# Patient Record
Sex: Male | Born: 1985 | Race: Black or African American | Hispanic: No | Marital: Single | Smoking: Former smoker
Health system: Southern US, Community
[De-identification: ages and names within clinical notes are randomized; demographics above are authoritative.]

## PROBLEM LIST (undated history)

## (undated) DIAGNOSIS — I10 Essential (primary) hypertension: Secondary | ICD-10-CM

## (undated) HISTORY — DX: Essential (primary) hypertension: I10

---

## 2011-05-19 ENCOUNTER — Inpatient Hospital Stay (HOSPITAL_COMMUNITY)
Admission: EM | Admit: 2011-05-19 | Discharge: 2011-05-26 | DRG: 638 | Disposition: A | Discharge status: Home / Self Care | Payer: Self-pay | Attending: Internal Medicine | Admitting: Internal Medicine

## 2011-05-19 DIAGNOSIS — E871 Hypo-osmolality and hyponatremia: Secondary | ICD-10-CM | POA: Diagnosis present

## 2011-05-19 DIAGNOSIS — E1310 Other specified diabetes mellitus with ketoacidosis without coma: Principal | ICD-10-CM | POA: Diagnosis present

## 2011-05-19 DIAGNOSIS — E7800 Pure hypercholesterolemia, unspecified: Secondary | ICD-10-CM | POA: Diagnosis present

## 2011-05-19 DIAGNOSIS — D72829 Elevated white blood cell count, unspecified: Secondary | ICD-10-CM | POA: Diagnosis present

## 2011-05-19 DIAGNOSIS — F17200 Nicotine dependence, unspecified, uncomplicated: Secondary | ICD-10-CM | POA: Diagnosis present

## 2011-05-19 DIAGNOSIS — N179 Acute kidney failure, unspecified: Secondary | ICD-10-CM | POA: Diagnosis present

## 2011-05-19 DIAGNOSIS — E669 Obesity, unspecified: Secondary | ICD-10-CM | POA: Diagnosis present

## 2011-05-19 DIAGNOSIS — E0781 Sick-euthyroid syndrome: Secondary | ICD-10-CM | POA: Diagnosis present

## 2011-05-19 DIAGNOSIS — I10 Essential (primary) hypertension: Secondary | ICD-10-CM | POA: Diagnosis present

## 2011-05-20 ENCOUNTER — Inpatient Hospital Stay (HOSPITAL_COMMUNITY): Payer: Self-pay

## 2011-05-20 LAB — POCT I-STAT, CHEM 8
BUN: 36 mg/dL — ABNORMAL HIGH (ref 6–23)
Chloride: 107 mEq/L (ref 96–112)
Creatinine, Ser: 1.8 mg/dL — ABNORMAL HIGH (ref 0.50–1.35)
Potassium: 5.6 mEq/L — ABNORMAL HIGH (ref 3.5–5.1)
Sodium: 134 mEq/L — ABNORMAL LOW (ref 135–145)

## 2011-05-20 LAB — GLUCOSE, CAPILLARY
Glucose-Capillary: >600 mg/dL (ref 70–99)
Glucose-Capillary: >600 mg/dL (ref 70–99)
Glucose-Capillary: >600 mg/dL (ref 70–99)
Glucose-Capillary: >600 mg/dL (ref 70–99)
Glucose-Capillary: >600 mg/dL (ref 70–99)
Glucose-Capillary: 477 mg/dL — ABNORMAL HIGH (ref 70–99)
Glucose-Capillary: 304 mg/dL — ABNORMAL HIGH (ref 70–99)
Glucose-Capillary: 261 mg/dL — ABNORMAL HIGH (ref 70–99)
Glucose-Capillary: 239 mg/dL — ABNORMAL HIGH (ref 70–99)
Glucose-Capillary: 220 mg/dL — ABNORMAL HIGH (ref 70–99)
Glucose-Capillary: 205 mg/dL — ABNORMAL HIGH (ref 70–99)

## 2011-05-20 LAB — CARDIAC PANEL(CRET KIN+CKTOT+MB+TROPI)
Relative Index: 1 (ref 0.0–2.5)
Troponin I: <0.3 ng/mL (ref <0.30)
Troponin I: <0.3 ng/mL (ref <0.30)

## 2011-05-20 LAB — BASIC METABOLIC PANEL
BUN: 34 mg/dL — ABNORMAL HIGH (ref 6–23)
BUN: 37 mg/dL — ABNORMAL HIGH (ref 6–23)
BUN: 36 mg/dL — ABNORMAL HIGH (ref 6–23)
BUN: 32 mg/dL — ABNORMAL HIGH (ref 6–23)
CO2: 11 mEq/L — ABNORMAL LOW (ref 19–32)
CO2: 20 mEq/L (ref 19–32)
Calcium: 11.2 mg/dL — ABNORMAL HIGH (ref 8.4–10.5)
Calcium: 11.3 mg/dL — ABNORMAL HIGH (ref 8.4–10.5)
Calcium: 10.5 mg/dL (ref 8.4–10.5)
Chloride: 105 mEq/L (ref 96–112)
Chloride: 116 mEq/L — ABNORMAL HIGH (ref 96–112)
Chloride: 116 mEq/L — ABNORMAL HIGH (ref 96–112)
Creatinine, Ser: 2.34 mg/dL — ABNORMAL HIGH (ref 0.50–1.35)
GFR calc Af Amer: 43 mL/min — ABNORMAL LOW (ref >60)
GFR calc Af Amer: 42 mL/min — ABNORMAL LOW (ref >60)
GFR calc Af Amer: 50 mL/min — ABNORMAL LOW (ref >60)
GFR calc non Af Amer: 31 mL/min — ABNORMAL LOW (ref >60)
GFR calc non Af Amer: 41 mL/min — ABNORMAL LOW (ref >60)
Glucose, Bld: 234 mg/dL — ABNORMAL HIGH (ref 70–99)
Glucose, Bld: 193 mg/dL — ABNORMAL HIGH (ref 70–99)
Glucose, Bld: 320 mg/dL — ABNORMAL HIGH (ref 70–99)
Potassium: 4.1 mEq/L (ref 3.5–5.1)
Potassium: 4.4 mEq/L (ref 3.5–5.1)
Potassium: 4.4 mEq/L (ref 3.5–5.1)
Potassium: 4.5 mEq/L (ref 3.5–5.1)
Sodium: 148 mEq/L — ABNORMAL HIGH (ref 135–145)
Sodium: 149 mEq/L — ABNORMAL HIGH (ref 135–145)

## 2011-05-20 LAB — DIFFERENTIAL
Basophils Absolute: 0 10*3/uL (ref 0.0–0.1)
Eosinophils Absolute: 0 10*3/uL (ref 0.0–0.7)
Eosinophils Relative: 0 % (ref 0–5)
Lymphocytes Relative: 8 % — ABNORMAL LOW (ref 12–46)

## 2011-05-20 LAB — CBC
MCH: 29.1 pg (ref 26.0–34.0)
MCV: 76.5 fL — ABNORMAL LOW (ref 78.0–100.0)
Platelets: 260 10*3/uL (ref 150–400)
RDW: 13.3 % (ref 11.5–15.5)
WBC: 23.7 10*3/uL — ABNORMAL HIGH (ref 4.0–10.5)

## 2011-05-20 LAB — POCT I-STAT 3, VENOUS BLOOD GAS (G3P V)
Acid-base deficit: 16 mmol/L — ABNORMAL HIGH (ref 0.0–2.0)
O2 Saturation: 77 %
Patient temperature: 37
pO2, Ven: 52 mmHg — ABNORMAL HIGH (ref 30.0–45.0)

## 2011-05-20 LAB — MAGNESIUM: Magnesium: 3.8 mg/dL — ABNORMAL HIGH (ref 1.5–2.5)

## 2011-05-20 LAB — RAPID URINE DRUG SCREEN, HOSP PERFORMED
Amphetamines: NOT DETECTED
Barbiturates: NOT DETECTED
Benzodiazepines: NOT DETECTED
Tetrahydrocannabinol: NOT DETECTED

## 2011-05-20 LAB — URINALYSIS, ROUTINE W REFLEX MICROSCOPIC
Bilirubin Urine: NEGATIVE
Leukocytes, UA: NEGATIVE
Nitrite: NEGATIVE
Specific Gravity, Urine: 1.005 (ref 1.005–1.030)
Urobilinogen, UA: 0.2 mg/dL (ref 0.0–1.0)
pH: 5.5 (ref 5.0–8.0)

## 2011-05-20 LAB — LIPASE, BLOOD: Lipase: 137 U/L — ABNORMAL HIGH (ref 11–59)

## 2011-05-20 LAB — URINE MICROSCOPIC-ADD ON

## 2011-05-20 LAB — HEPATIC FUNCTION PANEL
Alkaline Phosphatase: 121 U/L — ABNORMAL HIGH (ref 39–117)
Indirect Bilirubin: 0.4 mg/dL (ref 0.3–0.9)
Total Protein: 8.9 g/dL — ABNORMAL HIGH (ref 6.0–8.3)

## 2011-05-20 LAB — LIPID PANEL
HDL: 52 mg/dL (ref >39)
LDL Cholesterol: 126 mg/dL — ABNORMAL HIGH (ref 0–99)
Triglycerides: 364 mg/dL — ABNORMAL HIGH (ref <150)
VLDL: 73 mg/dL — ABNORMAL HIGH (ref 0–40)

## 2011-05-20 LAB — POCT I-STAT 3, ART BLOOD GAS (G3+)
Acid-base deficit: 15 mmol/L — ABNORMAL HIGH (ref 0.0–2.0)
Patient temperature: 98.2
pH, Arterial: 7.223 — ABNORMAL LOW (ref 7.350–7.450)

## 2011-05-20 LAB — TSH: TSH: 0.235 u[IU]/mL — ABNORMAL LOW (ref 0.350–4.500)

## 2011-05-21 LAB — GLUCOSE, CAPILLARY
Glucose-Capillary: 277 mg/dL — ABNORMAL HIGH (ref 70–99)
Glucose-Capillary: 221 mg/dL — ABNORMAL HIGH (ref 70–99)
Glucose-Capillary: 189 mg/dL — ABNORMAL HIGH (ref 70–99)
Glucose-Capillary: 200 mg/dL — ABNORMAL HIGH (ref 70–99)
Glucose-Capillary: 457 mg/dL — ABNORMAL HIGH (ref 70–99)
Glucose-Capillary: 510 mg/dL — ABNORMAL HIGH (ref 70–99)
Glucose-Capillary: 537 mg/dL — ABNORMAL HIGH (ref 70–99)

## 2011-05-21 LAB — CBC
MCHC: 36.7 g/dL — ABNORMAL HIGH (ref 30.0–36.0)
RDW: 14.4 % (ref 11.5–15.5)
WBC: 18.6 10*3/uL — ABNORMAL HIGH (ref 4.0–10.5)

## 2011-05-21 LAB — COMPREHENSIVE METABOLIC PANEL
ALT: 217 U/L — ABNORMAL HIGH (ref 0–53)
AST: 350 U/L — ABNORMAL HIGH (ref 0–37)
Albumin: 4.2 g/dL (ref 3.5–5.2)
Alkaline Phosphatase: 89 U/L (ref 39–117)
Chloride: 117 mEq/L — ABNORMAL HIGH (ref 96–112)
Potassium: 3.7 mEq/L (ref 3.5–5.1)
Sodium: 151 mEq/L — ABNORMAL HIGH (ref 135–145)
Total Bilirubin: 0.7 mg/dL (ref 0.3–1.2)
Total Protein: 7.6 g/dL (ref 6.0–8.3)

## 2011-05-21 LAB — BASIC METABOLIC PANEL
BUN: 28 mg/dL — ABNORMAL HIGH (ref 6–23)
BUN: 20 mg/dL (ref 6–23)
BUN: 16 mg/dL (ref 6–23)
CO2: 19 mEq/L (ref 19–32)
Calcium: 9.5 mg/dL (ref 8.4–10.5)
Chloride: 115 mEq/L — ABNORMAL HIGH (ref 96–112)
Chloride: 116 mEq/L — ABNORMAL HIGH (ref 96–112)
Creatinine, Ser: 1.68 mg/dL — ABNORMAL HIGH (ref 0.50–1.35)
Creatinine, Ser: 1.56 mg/dL — ABNORMAL HIGH (ref 0.50–1.35)
GFR calc Af Amer: >60 mL/min (ref >60)
GFR calc Af Amer: >60 mL/min (ref >60)
GFR calc non Af Amer: 50 mL/min — ABNORMAL LOW (ref >60)
Glucose, Bld: 137 mg/dL — ABNORMAL HIGH (ref 70–99)
Potassium: 3.9 mEq/L (ref 3.5–5.1)
Potassium: 4.7 mEq/L (ref 3.5–5.1)

## 2011-05-22 LAB — CBC
Platelets: 225 10*3/uL (ref 150–400)
RBC: 5.41 MIL/uL (ref 4.22–5.81)
WBC: 9.8 10*3/uL (ref 4.0–10.5)

## 2011-05-22 LAB — GLUCOSE, CAPILLARY
Glucose-Capillary: 441 mg/dL — ABNORMAL HIGH (ref 70–99)
Glucose-Capillary: 321 mg/dL — ABNORMAL HIGH (ref 70–99)
Glucose-Capillary: 316 mg/dL — ABNORMAL HIGH (ref 70–99)
Glucose-Capillary: 367 mg/dL — ABNORMAL HIGH (ref 70–99)

## 2011-05-22 LAB — BASIC METABOLIC PANEL
CO2: 24 mEq/L (ref 19–32)
Chloride: 109 mEq/L (ref 96–112)
Sodium: 146 mEq/L — ABNORMAL HIGH (ref 135–145)

## 2011-05-22 LAB — T4, FREE: Free T4: 0.87 ng/dL (ref 0.80–1.80)

## 2011-05-22 LAB — TSH: TSH: 0.306 u[IU]/mL — ABNORMAL LOW (ref 0.350–4.500)

## 2011-05-23 LAB — GLUCOSE, CAPILLARY
Glucose-Capillary: 556 mg/dL (ref 70–99)
Glucose-Capillary: 377 mg/dL — ABNORMAL HIGH (ref 70–99)

## 2011-05-23 LAB — BASIC METABOLIC PANEL
BUN: 16 mg/dL (ref 6–23)
Chloride: 112 mEq/L (ref 96–112)
Creatinine, Ser: 1.05 mg/dL (ref 0.50–1.35)
GFR calc Af Amer: >60 mL/min (ref >60)
Glucose, Bld: 380 mg/dL — ABNORMAL HIGH (ref 70–99)

## 2011-05-24 LAB — GLUCOSE, CAPILLARY
Glucose-Capillary: 287 mg/dL — ABNORMAL HIGH (ref 70–99)
Glucose-Capillary: 359 mg/dL — ABNORMAL HIGH (ref 70–99)

## 2011-05-24 LAB — BASIC METABOLIC PANEL
Calcium: 9.7 mg/dL (ref 8.4–10.5)
Chloride: 104 mEq/L (ref 96–112)
Creatinine, Ser: 1 mg/dL (ref 0.50–1.35)
GFR calc Af Amer: >60 mL/min (ref >60)
GFR calc non Af Amer: >60 mL/min (ref >60)

## 2011-05-25 LAB — BASIC METABOLIC PANEL
CO2: 31 mEq/L (ref 19–32)
Chloride: 102 mEq/L (ref 96–112)
GFR calc Af Amer: >60 mL/min (ref >60)
Potassium: 4 mEq/L (ref 3.5–5.1)
Sodium: 141 mEq/L (ref 135–145)

## 2011-05-25 LAB — GLUCOSE, CAPILLARY
Glucose-Capillary: 289 mg/dL — ABNORMAL HIGH (ref 70–99)
Glucose-Capillary: 290 mg/dL — ABNORMAL HIGH (ref 70–99)
Glucose-Capillary: 199 mg/dL — ABNORMAL HIGH (ref 70–99)

## 2011-05-25 LAB — INSULIN-LIKE GROWTH FACTOR: Somatomedin C: 111 ng/mL (ref 100–472)

## 2011-05-25 NOTE — H&P (Signed)
NAMEMarland Kitchen  Brad Murphy, Brad Murphy NO.:  [id number]  MEDICAL RECORD NO.:  [id number]  LOCATION:  2104                         FACILITY:  [facility]  PHYSICIAN:  Eduard Clos, MDDATE OF BIRTH:  1985-11-05  DATE OF ADMISSION:  05/19/2011 DATE OF DISCHARGE:                             HISTORY & PHYSICAL   PRIMARY CARE PHYSICIAN:  Unassigned.  CHIEF COMPLAINT:  Generalized weakness.  HISTORY OF PRESENT ILLNESS:  A 25 year old male with no significant past medical history has been experiencing weakness with polyuria and nausea, vomiting over the last 6 days, comes to the ER and at this time his labs show increased blood sugar more than 700 with an anion gap of 15.  At this time, the patient has been admitted for DKA.  The patient denies any chest pain.  Denies any shortness of breath. Denies any palpitation.  Has some dizziness.  Denies any headache or visual symptoms or focal deficit.  Denies any abdominal pain, dysuria, discharge, or diarrhea.  PAST MEDICAL HISTORY:  Nothing significant.  PAST SURGICAL HISTORY:  None.  MEDICATIONS PER ADMISSION:  None.  FAMILY HISTORY:  Significant for diabetes mellitus type 2.  SOCIAL HISTORY:  The patient smokes cigarettes.  Denies any alcohol or drug abuse.  REVIEW OF SYSTEMS:  Per history of present illness, nothing else significant.  PHYSICAL EXAMINATION:  GENERAL:  The patient examined at bedside, not in acute distress. VITAL SIGNS:  Blood pressure is 150/50, pulse is 119 per minute, temperature 97.4, respirations 18 per minute, O2 sat 99%.  HEENT: Anicteric.  No pallor.  No discharge from ears, eyes, nose, or mouth. CHEST:  Bilateral air entry present.  No rhonchi.  No crepitation. HEART: S1, S2 heard. ABDOMEN:  Soft, nontender.  Bowel sounds heard. CNS:  Alert, awake, oriented to time, place and person.  Moves upper and lower extremities 5/5. EXTREMITIES:  Peripheral pulses felt.  No edema.  LABORATORY DATA:  EKG  shows normal sinus rhythm, heart rate of 115 beats per minute with nonspecific ST-T changes.  Hemoglobin is 18.7, hematocrit is 55.  Basic metabolic panel:  Sodium 134, potassium 5.6, chloride 107, bicarb was 12, anion gap was 15, BUN 36, creatinine 1.8. UA is showing negative for nitrites and leukocytes, glucose more than 1000, ketones 40.  ASSESSMENT: 1. Diabetic ketoacidosis with new-onset diabetes. 2. Elevated blood pressure. 3. Renal failure. 4. Mild hyperkalemia. 5. Obesity.  PLAN: 1. At this time, admit the patient to step-down unit. 2. For his DKA, the patient will be kept on IV insulin as per DKA     protocol, aggressive hydration with fluid.  At this time, 3 L     normal saline bolus has been ordered after which we will place the     patient on normal saline 200 mL/hour.  Slowly change the patient to     Lantus long-acting insulin once his anion gap is corrected.  At     this time, we will be cycling cardiac markers.  We will check     hemoglobin A1c, fasting lipid panel, and TSH, and also get a     portable chest x-ray.  We will add LFTs and  lipase to the line as     the patient does have some nausea, vomiting. 3. Renal failure which could be from his dehydration.  We will be     frequently checking BMET.  I think his creatinine will improve with     hydration. 4. Elevated blood pressure.  We will need to closely observation his     blood pressure during his stay.  If he is showing persistent high     blood pressures then definite antihypertensive may have to be     started. 5. The patient does smoke cigarettes.  The patient will need tobacco     abuse counseling.     Eduard Clos, MD     ANK/MEDQ  D:  05/20/2011  T:  05/20/2011  Job:  [id number]  Electronically Signed by Midge Minium MD on 05/25/2011 01:23:19 AM

## 2011-05-25 NOTE — Consult Note (Signed)
NAMEMarland Kitchen  DONEVAN, BILLER NO.:  [id number]  MEDICAL RECORD NO.:  [id number]  LOCATION:  5021                         FACILITY:  [facility]  PHYSICIAN:  Tera Mater. Evlyn Kanner, M.D. DATE OF BIRTH:  1986-05-31  DATE OF CONSULTATION:  05/24/2011 DATE OF DISCHARGE:                                CONSULTATION   HISTORY OF PRESENT ILLNESS:  Brad Murphy is a 25 year old black male admitted by the hospital service on May 19, 2011.  He presented with new-onset diabetes with really diabetic ketoacidosis.  He was treated exceptionally well with an IV insulin drip, appropriate fluids, careful management of his electrolytes, and his acidosis cleared fairly quickly. The patient had really no past history prior to this.  He said he does not have any problems or symptoms prior to about a week before coming in.  His max ever weight was around 400 pounds later earlier this year. He had previously played football for [employer] and [employer].  Since he stopped playing football, he has gained some more weight progressively.  He is not working at the present time.  He states he has had no unusual symptoms or illnesses.  In fact, he feels fairly well today.  His only complaint is feeling somewhat hot.  He specifically has no blurred vision or neuropathic complaints.  His bowels are working felt well.  His appetite is good.  He ate pretty all much all of breakfast this morning.  PHYSICAL EXAMINATION:  HEENT:  He does have some exotropia.  Sclerae anicteric. NECK:  No thyromegaly is present.  He has deep vein acanthosis. LUNGS:  Clear. EXTREMITIES:  Distal pulses are intact.  There is some chronic venous stasis changes noted. NEUROLOGIC:  He is alert, awake, and mentating well.  LABORATORY DATA:  From yesterday; sodium 149, potassium 4.7, chloride 112, CO2 of 22, BUN 16, creatinine 1.05, and glucose of 380.  Estimated GFR greater than 60 with a calcium of 10.3.  A TSH yesterday  was 0.306 with a free T4 of 0.87.  Labs at presentation, chemistry; sodium 134, potassium 5.6, chloride 107, glucose is listed as greater than 700, I cannot totally get an actual number there, BUN 36, and creatinine 1.8. A urinalysis was positive for 40 mg of ketone, but not any higher.  The urinalysis is otherwise negative.  Blood gas; pH 7.167, pCO2 of 31, and pO2 is less than 52.  I suspect is a venous sample overall.  His troponin was less than 0.30.  His lipid profile reveals total cholesterol 251, triglycerides 264, HDL of 32, and LDL of 126.  Urine drug screen was negative. Pancreatic enzymes were mildly up of lipase of 137.  At one occasion, I can tell that the rechecking A1c was normal at 11.1% with mean plasma glucose of 272.  His renal function has progressively gotten better.  Repeat lipase was normal at 47.  In summary, I have a 25 year old black male presenting with a fairly impressive hyperglycemia with mild acidosis and ketosis.  I suspect this represents atypical or [place] type of diabetes seen in a young black males.  This is a situation where there is temporary  lack of insulin sufficiency so they can develop ketoacidosis, but was not totally insulin deficient.  What the typical pattern is that oral agents can be used with some usefulness.  Given that he has gone back to a normal creatinine and no acidosis, I think starting some metformin is reasonable.  He would be a candidate to consider one of the GLP-1 agents at a later time.  At the present time, his insulin needs are pretty dramatic needing 100 of Lantus twice a day with large sliding scale.  I suspect that 500 insulin may be needed as well.  For completeness sake, we will check IGF-1 morning cortisol just to make sure that there are not any other unusual reasons for the extreme insulin resistance acanthosis though A1c mainly suspect this was slowly developing issue.  I will follow the patient along with  you.  Thank you for allowing me to participate in the care of this patient.          ______________________________ Tera Mater. Evlyn Kanner, M.D.     SAS/MEDQ  D:  05/24/2011  T:  05/24/2011  Job:  [id number]  Electronically Signed by Adrian Prince M.D. on 05/25/2011 02:06:44 PM

## 2011-05-26 LAB — GLUCOSE, CAPILLARY
Glucose-Capillary: 135 mg/dL — ABNORMAL HIGH (ref 70–99)
Glucose-Capillary: 183 mg/dL — ABNORMAL HIGH (ref 70–99)
Glucose-Capillary: 191 mg/dL — ABNORMAL HIGH (ref 70–99)

## 2011-06-11 ENCOUNTER — Inpatient Hospital Stay (HOSPITAL_COMMUNITY)
Admission: EM | Admit: 2011-06-11 | Discharge: 2011-06-14 | DRG: 638 | Disposition: A | Discharge status: Home / Self Care | Payer: Self-pay | Attending: Internal Medicine | Admitting: Internal Medicine

## 2011-06-11 ENCOUNTER — Encounter: Payer: Self-pay | Admitting: Internal Medicine

## 2011-06-11 DIAGNOSIS — F17200 Nicotine dependence, unspecified, uncomplicated: Secondary | ICD-10-CM | POA: Diagnosis present

## 2011-06-11 DIAGNOSIS — Z6841 Body Mass Index (BMI) 40.0 and over, adult: Secondary | ICD-10-CM

## 2011-06-11 DIAGNOSIS — E1310 Other specified diabetes mellitus with ketoacidosis without coma: Principal | ICD-10-CM | POA: Diagnosis present

## 2011-06-11 DIAGNOSIS — Z91199 Patient's noncompliance with other medical treatment and regimen due to unspecified reason: Secondary | ICD-10-CM

## 2011-06-11 DIAGNOSIS — Z9119 Patient's noncompliance with other medical treatment and regimen: Secondary | ICD-10-CM

## 2011-06-11 DIAGNOSIS — E1010 Type 1 diabetes mellitus with ketoacidosis without coma: Secondary | ICD-10-CM

## 2011-06-11 DIAGNOSIS — Z79899 Other long term (current) drug therapy: Secondary | ICD-10-CM

## 2011-06-11 DIAGNOSIS — E785 Hyperlipidemia, unspecified: Secondary | ICD-10-CM | POA: Diagnosis present

## 2011-06-11 DIAGNOSIS — Z794 Long term (current) use of insulin: Secondary | ICD-10-CM

## 2011-06-11 LAB — GLUCOSE, CAPILLARY
Glucose-Capillary: >600 mg/dL (ref 70–99)
Glucose-Capillary: >600 mg/dL (ref 70–99)
Glucose-Capillary: >600 mg/dL (ref 70–99)

## 2011-06-11 LAB — BASIC METABOLIC PANEL
BUN: 18 mg/dL (ref 6–23)
CO2: 24 mEq/L (ref 19–32)
Calcium: 11.3 mg/dL — ABNORMAL HIGH (ref 8.4–10.5)
Chloride: 101 mEq/L (ref 96–112)
Creatinine, Ser: 1.25 mg/dL (ref 0.50–1.35)
GFR calc Af Amer: >60 mL/min (ref >60)
GFR calc non Af Amer: >60 mL/min (ref >60)
GFR calc non Af Amer: >60 mL/min (ref >60)
Glucose, Bld: 1130 mg/dL (ref 70–99)
Glucose, Bld: 247 mg/dL — ABNORMAL HIGH (ref 70–99)
Potassium: 4.3 mEq/L (ref 3.5–5.1)
Sodium: 132 mEq/L — ABNORMAL LOW (ref 135–145)
Sodium: 141 mEq/L (ref 135–145)

## 2011-06-11 LAB — URINALYSIS, ROUTINE W REFLEX MICROSCOPIC
Bilirubin Urine: NEGATIVE
Hgb urine dipstick: NEGATIVE
Ketones, ur: 15 mg/dL — AB
Nitrite: NEGATIVE
Protein, ur: NEGATIVE mg/dL
Specific Gravity, Urine: 1.032 — ABNORMAL HIGH (ref 1.005–1.030)
Urobilinogen, UA: 0.2 mg/dL (ref 0.0–1.0)

## 2011-06-11 NOTE — H&P (Signed)
Hospital Admission Note Date: 06/11/2011  Patient name: Brad Murphy Medical record number: [id number] Date of birth: [birth year] Age: 25 y.o. Gender: male PCP: No primary provider on file.  Medical Service: Medicine Teaching Service  Attending physician:  Dr. Meredith Pel Resident (R2/R3):  Dr. Gilford Rile   Pager: [phone] Resident (R1):  Dr. Dierdre Searles    Pager: [phone]  Chief Complaint: Nausea, polyuria, polydipsia.   History of Present Illness: This is a 25 year old male with a past medical history of recently diagnosed diabetes with an A1c of 11.1 which was diagnosed 2 weeks ago when the patient presented with DKA to [facility], patient was treated and given insulin was told to followup with [facility]. However the patient did not followup and ran out of his insulin one week ago, and since he has been progressively more nauseated and complaining of polyuria and polydipsia this led him to come to the ED once more today. Upon arrival to the ED the patient's CBG was 1130, and anion gap of 16, aside from the polyuria polydipsia and nausea patient denies any other complaints to report that he's feeling well and denied any other new complaints, denied fever chills, shortness of breath or chest pain.  Medication:   Lantus insulin 120 units subq b.i.d.  NovoLog insulin 15 units subq 3 times daily with meals.  Lisinopril 10 mg p.o. daily.  Metformin 1000 mg p.o. b.i.d. with meals.    Allergies:  None.  Past Medical and Surgical History:  None.     Family History:  Significant for diabetes mellitus type 2, HLD and HTN, negative for premature CAD    Social History:  The patient smokes 1-5 cigarettes per day.  Denies any alcohol or drug abuse.   Review of Systems: Negative, except per HPI.   Physical Exam: Vitals: T=98.1,  BP=123/60 HR=127, RR=20, O2 Sat= 96% on RA. General:  Obese, alert, well-developed, and cooperative to examination.   Head:  normocephalic and atraumatic.   Eyes:  vision  grossly intact, pupils equal, pupils round, pupils reactive to light, no injection and anicteric.   Mouth:  pharynx pink and moist, no erythema, and no exudates.   Neck:  supple, full ROM, no thyromegaly, no JVD, and no carotid bruits.   Lungs:  normal respiratory effort, no accessory muscle use, normal breath sounds, no crackles, and no wheezes. Heart:  normal rate, regular rhythm, no murmur, no gallop, and no rub.   Abdomen:  soft, non-tender, normal bowel sounds, no distention, no guarding, no rebound tenderness, no hepatomegaly, and no splenomegaly.   Msk:  no joint swelling, no joint warmth, and no redness over joints.   Pulses:  2+ DP/PT pulses bilaterally Extremities:  No cyanosis, clubbing, edema Neurologic:  alert & oriented X3, cranial nerves II-XII intact, strength normal in all extremities, sensation intact to light touch, and gait normal.   Skin:  turgor normal and no rashes.   Psych:  Oriented X3, memory intact for recent and remote, normally interactive, good eye contact, not anxious appearing, and not depressed appearing.   Lab results: Admission on 06/11/2011  Component Value Range  . Glucose-Capillary (mg/dL) >621* 30-86  . Comment 1  Documented in Chart    . Comment 2  Control and instrumentation engineer    . Color, Urine  YELLOW  YELLOW  . Appearance  CLEAR  CLEAR  . Specific Gravity, Urine  1.032* 1.005-1.030  . pH  6.5  5.0-8.0  . Glucose, UA (mg/dL) >5784* NEGATIVE  . Hgb urine dipstick  NEGATIVE  NEGATIVE  . Bilirubin Urine  NEGATIVE  NEGATIVE  . Ketones, ur (mg/dL) 15* NEGATIVE  . Protein, ur (mg/dL) NEGATIVE  NEGATIVE  . Urobilinogen, UA (mg/dL) 0.2  4.0-9.8  . Nitrite  NEGATIVE  NEGATIVE  . Leukocytes, UA  NEGATIVE  NEGATIVE  . Squamous Epithelial / LPF  RARE  RARE  . Sodium (mEq/L) 132* 135-145  . Potassium (mEq/L) 4.7  3.5-5.1   HEMOLYSIS AT THIS LEVEL MAY AFFECT RESULT  . Chloride (mEq/L) 92* 96-112  . CO2 (mEq/L) 24  19-32  . Glucose, Bld (mg/dL) 1191* 47-82   Comment:  REPEATED TO VERIFY                          CRITICAL RESULT CALLED TO, READ BACK BY AND VERIFIED WITH:                          R HORIZA,RN 1516 06/11/11 D BRADLEY                          RESULT CONFIRMED BY AUTOMATED DILUTION  . BUN (mg/dL) 18  9-56  . Creatinine, Ser (mg/dL) 2.13  0.86-5.78  . Calcium (mg/dL) 46.9* 6.2-95.2  . GFR calc non Af Amer (mL/min) >60  >60  . GFR calc Af Amer (mL/min) >60  >60   Comment:                                 The eGFR has been calculated                          using the MDRD equation.                          This calculation has not been                          validated in all clinical                          situations.                          eGFR's persistently                          <60 mL/min signify                          possible Chronic Kidney Disease.  . Glucose-Capillary (mg/dL) >841* 32-44     Imaging results:  None  Assessment & Plan by Problem:  DKA: Patient is clinically stable, CBG's 1130 on admission with AG 16, with history of recently diagnosed and hospital admission for DKA secondary to DM with last hemoglobin A1c of 11.1. Since the patient has been using insulin which he was given from the hospital, but this has runout 1 week ago and patient has been without insulin for 1 week. At this point I do not suspect an infecious causes such as PNA, UTI or an ischemic/inflammatory conditions (MI, Pancreatitis).  Plan: Will admit to SDU Will check HgA1c, Will treat patient's DKA with  Insulin drip,  saline IV fluids  Will check blood glucose every 2 hours , as patient's blood glucose reach 250's, will continue with D5W until his anion gap is closed, when this is accomplished, will stop the insulin drip and start long acting insulin with sliding scale coverage and start checking blood sugars before meals and at bedtime.     PGY3______________________________________     ATTENDING: I performed and/or observed a history  and physical examination of the patient.  I discussed the case with the residents as noted and reviewed the residents' notes.  I agree with the findings and plan--please refer to the attending physician note for more details.  Signature________________________________  Printed Name_____________________________

## 2011-06-12 LAB — GLUCOSE, CAPILLARY
Glucose-Capillary: 229 mg/dL — ABNORMAL HIGH (ref 70–99)
Glucose-Capillary: 235 mg/dL — ABNORMAL HIGH (ref 70–99)

## 2011-06-12 LAB — CBC
HCT: 40.4 % (ref 39.0–52.0)
Hemoglobin: 14.2 g/dL (ref 13.0–17.0)
MCH: 28 pg (ref 26.0–34.0)
MCHC: 35.1 g/dL (ref 30.0–36.0)

## 2011-06-12 LAB — LIPID PANEL
LDL Cholesterol: 86 mg/dL (ref 0–99)
Triglycerides: 196 mg/dL — ABNORMAL HIGH (ref <150)
VLDL: 39 mg/dL (ref 0–40)

## 2011-06-12 LAB — HEMOGLOBIN A1C: Hgb A1c MFr Bld: 12.7 % — ABNORMAL HIGH (ref <5.7)

## 2011-06-12 LAB — BASIC METABOLIC PANEL
BUN: 10 mg/dL (ref 6–23)
Calcium: 9.9 mg/dL (ref 8.4–10.5)
Chloride: 101 mEq/L (ref 96–112)
Creatinine, Ser: 0.9 mg/dL (ref 0.50–1.35)
Creatinine, Ser: 0.89 mg/dL (ref 0.50–1.35)
GFR calc Af Amer: >60 mL/min (ref >60)
GFR calc non Af Amer: >60 mL/min (ref >60)

## 2011-06-13 DIAGNOSIS — E1010 Type 1 diabetes mellitus with ketoacidosis without coma: Secondary | ICD-10-CM

## 2011-06-13 LAB — GLUCOSE, CAPILLARY
Glucose-Capillary: 444 mg/dL — ABNORMAL HIGH (ref 70–99)
Glucose-Capillary: 496 mg/dL — ABNORMAL HIGH (ref 70–99)
Glucose-Capillary: 273 mg/dL — ABNORMAL HIGH (ref 70–99)
Glucose-Capillary: 303 mg/dL — ABNORMAL HIGH (ref 70–99)
Glucose-Capillary: 358 mg/dL — ABNORMAL HIGH (ref 70–99)

## 2011-06-13 LAB — BASIC METABOLIC PANEL
BUN: 7 mg/dL (ref 6–23)
CO2: 28 mEq/L (ref 19–32)
Chloride: 104 mEq/L (ref 96–112)
Creatinine, Ser: 0.81 mg/dL (ref 0.50–1.35)
Glucose, Bld: 218 mg/dL — ABNORMAL HIGH (ref 70–99)

## 2011-06-14 LAB — BASIC METABOLIC PANEL
BUN: 6 mg/dL (ref 6–23)
Chloride: 102 mEq/L (ref 96–112)
Creatinine, Ser: 0.79 mg/dL (ref 0.50–1.35)
GFR calc Af Amer: >60 mL/min (ref >60)
GFR calc non Af Amer: >60 mL/min (ref >60)
Glucose, Bld: 240 mg/dL — ABNORMAL HIGH (ref 70–99)

## 2011-06-14 LAB — CBC
HCT: 39.7 % (ref 39.0–52.0)
Hemoglobin: 13.7 g/dL (ref 13.0–17.0)
MCHC: 34.5 g/dL (ref 30.0–36.0)
MCV: 79.2 fL (ref 78.0–100.0)
RDW: 13.4 % (ref 11.5–15.5)

## 2011-06-14 LAB — GLUCOSE, CAPILLARY
Glucose-Capillary: 272 mg/dL — ABNORMAL HIGH (ref 70–99)
Glucose-Capillary: 253 mg/dL — ABNORMAL HIGH (ref 70–99)

## 2011-06-17 ENCOUNTER — Encounter: Payer: Self-pay | Admitting: Internal Medicine

## 2011-06-17 ENCOUNTER — Telehealth: Payer: Self-pay | Admitting: Dietician

## 2011-06-17 ENCOUNTER — Ambulatory Visit (INDEPENDENT_AMBULATORY_CARE_PROVIDER_SITE_OTHER): Payer: Self-pay | Admitting: Internal Medicine

## 2011-06-17 DIAGNOSIS — E119 Type 2 diabetes mellitus without complications: Secondary | ICD-10-CM

## 2011-06-17 DIAGNOSIS — E1165 Type 2 diabetes mellitus with hyperglycemia: Secondary | ICD-10-CM | POA: Insufficient documentation

## 2011-06-17 LAB — GLUCOSE, CAPILLARY: Glucose-Capillary: 248 mg/dL — ABNORMAL HIGH (ref 70–99)

## 2011-06-17 MED ORDER — LISINOPRIL 2.5 MG PO TABS: 2.5000 mg | ORAL_TABLET | Freq: Every day | ORAL | Status: DC

## 2011-06-17 NOTE — Patient Instructions (Signed)
Please schedule a follow up appointment with our diabetes educator and counselor in 1week. Please schedule a follow up appointment with the clinic doctor in 1 month.

## 2011-06-17 NOTE — Progress Notes (Signed)
  Subjective:    Patient ID: Brad Murphy, male    DOB: September 18, 1986, 25 y.o.   MRN: [id number]  HPI: 25 year old man with newly diagnosed diabetes comes to the clinic for a hospital followup.  Patient was recently hospitalized through 06/11/2011 to 06/14/2011 for diabetic ketoacidosis.  He has not been checking his blood sugars because he does not have a meter. But he states that he has been taking NovoLog 70/30 twice daily. He denies any abdominal pain, nausea, vomiting or diarrhea.    Review of Systems  Constitutional: Negative for fever, chills, activity change, appetite change and fatigue.  HENT: Negative for nosebleeds, congestion, rhinorrhea, sneezing and postnasal drip.   Eyes: Negative for visual disturbance.  Respiratory: Negative for apnea, cough, choking, chest tightness, shortness of breath, wheezing and stridor.   Cardiovascular: Negative for chest pain, palpitations and leg swelling.  Gastrointestinal: Negative for nausea, diarrhea, constipation and abdominal distention.  Genitourinary: Negative for urgency, flank pain and decreased urine volume.  Musculoskeletal: Negative for arthralgias.  Neurological: Negative for seizures, speech difficulty and headaches.  Hematological: Negative for adenopathy.       Objective:   Physical Exam  Constitutional: He is oriented to person, place, and time. He appears well-developed and well-nourished. No distress.  HENT:  Head: Normocephalic and atraumatic.  Mouth/Throat: No oropharyngeal exudate.  Eyes: Conjunctivae and EOM are normal. Pupils are equal, round, and reactive to light.  Neck: Normal range of motion. Neck supple. No JVD present. No tracheal deviation present. No thyromegaly present.  Cardiovascular: Normal rate, regular rhythm, normal heart sounds and intact distal pulses.  Exam reveals no gallop and no friction rub.   No murmur heard. Pulmonary/Chest: Effort normal and breath sounds normal. No stridor. No respiratory  distress. He has no wheezes. He has no rales. He exhibits no tenderness.  Abdominal: Soft. Bowel sounds are normal. He exhibits no distension and no mass. There is no tenderness. There is no rebound and no guarding.  Musculoskeletal: Normal range of motion. He exhibits no edema and no tenderness.  Lymphadenopathy:    He has no cervical adenopathy.  Neurological: He is alert and oriented to person, place, and time. He has normal reflexes. He displays normal reflexes. No cranial nerve deficit. Coordination normal.  Skin: Skin is warm. He is not diaphoretic.          Assessment & Plan:

## 2011-06-17 NOTE — Telephone Encounter (Signed)
Left message. Call returned by his mother. Left another message for patient to call.

## 2011-06-18 LAB — MICROALBUMIN / CREATININE URINE RATIO: Microalb, Ur: 0.99 mg/dL (ref 0.00–1.89)

## 2011-06-18 LAB — BASIC METABOLIC PANEL WITH GFR
Calcium: 9 mg/dL (ref 8.4–10.5)
Chloride: 102 mEq/L (ref 96–112)
Creat: 0.89 mg/dL (ref 0.50–1.35)
GFR, Est African American: >60 mL/min (ref >60)
GFR, Est Non African American: >60 mL/min (ref >60)

## 2011-06-20 ENCOUNTER — Encounter: Payer: Self-pay | Admitting: Internal Medicine

## 2011-06-20 MED ORDER — GLUCOSE BLOOD VI STRP: ORAL_STRIP | Status: AC

## 2011-06-20 MED ORDER — WAVESENSE PRESTO W/DEVICE KIT: 1.0000 | PACK | Freq: Four times a day (QID) | Status: DC

## 2011-06-20 MED ORDER — LANCETS 30G MISC: Status: DC

## 2011-06-20 NOTE — Assessment & Plan Note (Signed)
Hospitalized through 06/11/2011 to 06/14/2003 for diabetic ketoacidosis.He has not been checking his blood sugars but states taking insulin 70/30 regularly.  - Will give him a script for meter. - We'll get UA and microalbumin to creatinine ratio. Start him on low dose ACE- I. - Will refer him to [facility] for financial assistance. - Will schedule and appointment with our diabetes educator and counselor. - Will get a  BMET.

## 2011-06-24 NOTE — Telephone Encounter (Signed)
Have not had return call form patient. Will try to touch base with him at his doctor visits on 07/13/11 with Dr. Candy Sledge.

## 2011-06-27 NOTE — Discharge Summary (Signed)
NAMEMarland Murphy  RODD, HEFT                ACCOUNT NO.:  [id number]  MEDICAL RECORD NO.:  [id number]  LOCATION:                                 FACILITY:  PHYSICIAN:  Clydia Llano, MD       DATE OF BIRTH:  17-Dec-1985  DATE OF ADMISSION:  05/19/2011 DATE OF DISCHARGE:  05/26/2011                              DISCHARGE SUMMARY   PRIMARY CARE PHYSICIAN:  [facility].  REASON FOR ADMISSION:  Generalized weakness.  DISCHARGE DIAGNOSES: 1. Diabetic ketoacidosis, resolved. 2. Diabetes mellitus, new onset. 3. Hypertension. 4. Acute renal failure, resolved. 5. Obesity. 6. Sick euthyroid syndrome.  DISCHARGE MEDICATIONS: 1. Lantus insulin 120 units subcu b.i.d. 2. NovoLog insulin 15 units subcu 3 times daily with meals. 3. Lisinopril 10 mg p.o. daily. 4. Metformin 1000 mg p.o. b.i.d. with meals.  BRIEF HISTORY AND EXAMINATION:  Mr. Dault is a 25 year old African American male with no significant past medical history who came into the hospital because of weakness.  The patient for 6 days prior to admission was experiencing the weakness along with polyuria, nausea, and vomiting at this time.  The patient does have morbid obesity with his weight around 400 kg.  Upon initial evaluation in the emergency department, the patient did have blood work consistent with DKA with blood glucose of 748, bicarb is 11, and renal failure with creatinine 1.8.  The patient was admitted to the hospital for further evaluation.  RADIOLOGY:  Abdominal ultrasound showed unremarkable.  Renal ultrasound somewhat limited because of body habitus.Brad Murphy  BRIEF HOSPITAL STAY: 1. DKA and new onset diabetes.  The patient was admitted to Step-Down     Unit because of the DKA.  He was started on IV insulin.  The     patient's blood pressure was controlled pretty fast and the gap was     closed as well as his acidosis resolved.  It was being noticed that     he required very high dose of Lantus insulin, so Dr. Adrian Prince     from Endocrine was consulted.  He suspected that the patient has     [place] type of diabetes seen in young black males.  Presentation     is atypical with very high hyperglycemia and mild ketoacidosis.     The patient is currently taking 120 units of Lantus twice a day     with 15 units of NovoLog with meals.  He suspected the U-500     insulin will be of some use in this young gentleman.  Currently,     continue same medication.  The patient is to follow up with     [facility] for further evaluation.. 2. Hypertension.  Blood pressure was in the high side of 140-150s for     systolic blood pressure.  Lisinopril 10 mg was added to his     medication as well as its glomerular protective effect. 3. Acute renal failure.  The patient's creatinine was 1.8 at the time     of admission, at the time of discharge was 0.9.  This is thought to     be secondary to dehydration from DKA and treated aggressively with  IV fluids. 4. Euthyroid sick syndrome.  The patient has TSH of 0.306.  His free     T4 is normal.  Total T4 is normal and the T3 is 61.  Dr. Evlyn Kanner     recommended to repeat the thyroid indices as an outpatient in 4-6     weeks.  DISCHARGE INSTRUCTIONS: 1. Activity:  As tolerated. 2. Disposition:  Home.  DIET:  Carbohydrate-modified diet.     Clydia Llano, MD     ME/MEDQ  D:  05/26/2011  T:  05/27/2011  Job:  [id number]  cc:   Clinic [facility]  Electronically Signed by Clydia Llano  on 06/27/2011 09:57:31 AM

## 2011-07-09 NOTE — Discharge Summary (Signed)
NAMEMarland Murphy  CAYCE, Brad Murphy NO.:  [id number]  MEDICAL RECORD NO.:  [id number]  LOCATION:  3037                         FACILITY:  [facility]  PHYSICIAN:  Ileana Roup, M.D.  DATE OF BIRTH:  [birth year]  DATE OF ADMISSION:  06/11/2011 DATE OF DISCHARGE:  06/14/2011                              DISCHARGE SUMMARY   PRIMARY CARE PHYSICIAN: [facility] outpatient [facility].  DISCHARGE DIAGNOSES: 1. Diabetic ketoacidosis  2. Recently diagnosed diabetes mellitus type 2. 3. Hyperlipidemia. 4. Class III, morbid obesity.  DISCHARGE MEDICATIONS WITH ACCURATE DOSES: 1. NovoLog 70/30, 50 units SQ twice daily with meals, breakfast and supper. 2. Metformin 500mg  2 tabs p.o. twice daily with meals.  DISPOSITION AND FOLLOWUP:  This is a 25 year old male with past medical history of recently diagnosed diabetes with a hemoglobin A1c of 11.1 on admission.  This patient was treated with a DKA protocol and discharged on June 14, 2011.    1. [name] at 10:15 a.m. on June 17, 2011 with Dr. Dorthula Rue.   During this visit, the patient will need to have his [employer] and MAP Program setup.   The patient is instructed to bring all of his identification information  and his financial documentations to facilitate the process.   He will need his BMET checked.   2. The patient will also need to have a followup appointment by [name]. He was taught on how to use his NovoLog 70/30 insulin upon discharge, and he will need more education and reinforcement on diet, lifestyle changes  and medications.  Also during last hospitalization, the patient was put on lisinopril 10 mg p.o. daily which he did not take after he was discharged.  During this admission, we did not start lisinopril  due to his acute DKA problem.  He will need to be reevaluated whether  he needs lisinopril daily.  Given his history of poorly controlled diabetes, I think we  could start him on lisinopril 5 mg p.o. daily, monitor his blood pressure and kidney function closely as an outpatient.  I did not write him prescription for lisinopril, which will need to be readdressed during his office visit with Dr. Dorthula Rue.  PROCEDURES:  There is no procedures performed during this admission.  CONSULTATIONS:  There is no consultations ordered during this admission. However, we did ask case manager to work with him to see if he can get any help in terms of his medication supplies.  BRIEF ADMITTING HISTORY AND PHYSICAL:  This is a 25 year old male with a past medical history of recently diagnosed diabetes with a hemoglobin A1c of 11.1 which was diagnosed 2 weeks ago when the patient presented with DKA to [facility], the patient was treated and given insulin and was told to follow up with [facility].  However, the patient did not follow up and ran out of his insulin 1 week ago, and since he has been progressively more nauseated and complaining of polyuria and polydipsia, this led him to come to the ED once more today, June 11, 2011.  Upon arrival to the ED, the patient's CBG was 1130, and anion gap of 16,  aside from the polyuria, polydipsia, and a nausea, the patient denies any other complaints to report that he is feeling well.  He denied any other new complaints, denied fever, chills, shortness of breath, or chest pain.  PHYSICAL EXAMINATION:   VITAL SIGNS:  T 98.1, BP 123/60, HR 127, RR 20, oxygen saturation 96% on room air. GENERAL:  Obese, alert, well developed, and cooperative to examination. HEENT:  normocephalic and atraumatic. vision grossly intact.   PERRL. No injection or icteric.  Mouth pink and moist.  No erythema and no exudates. NECK:  Supple.  Full ROM.  No thyromegaly, no JVD, and no carotid bruits. LUNGS:  Normal respiratory effort.  No accessory muscle use.  Normal breath sounds.  No crackles, and no wheezes. HEART:  Normal rate,  regular rhythm, no murmur, no gallops, and no rubs. ABDOMEN:  Soft.  No tenderness.  Normal bowel sounds.  No distention, no guarding, no rebound tenderness, no hepatomegaly, and no splenomegaly. MUSCULOSKELETAL:  No joint swelling, warmth, and redness over joints. Pulse 2+ DP/PT pulses bilaterally. EXTREMITIES:  No cyanosis, clubbing, edema. NEUROLOGIC:  Alert and oriented x3.  Cranial nerves II-XII intact. Strength normal in all extremities.  Sensation intact to light touch and gait normal. SKIN:  Turgor normal and no rashes. PSYCHIATRIC:  Oriented x3.  Memory intact for recent and remote, normally interactive, good eye contact, not anxious appearing, and not depressed appearing.  ADMISSION LABS:   Glucose more than 600.  Urine glucose more than 1000,urine ketones 15 mg/dL.  sodium 132, potassium 4.7, chloride 92,CO2 24, glucose 1130, BUN 18,  creatinine 1.25, calcium 11.3.  WBC 8.2, HB 14.2, HCT 40.4, and platelets 173.   Lipid panel; cholesterol 166, triglyceride 196, HDL 41, LDL 86,VLDL 39. Hemoglobin A1c 12.7 on this admission.  HOSPITAL COURSE BY PROBLEMS: 1. Diabetic ketoacidosis secondary to diabetes type 2.     Pt presented with CBG 1130 on admission with an AG 16.      He had recently been diagnosed with DM with recent     hospitalization for DKA secondary to diabetes mellitus.     This pt was noncompliant and did not fill his insulin prescription x 1 week,     and he was admitted again for DKA during this admission. His condition was      largely improved with DKA protocol and insuin treatment.     He is discharged on Novolog 70/30 and metformin.  2. Hyperlipidemia.       Given history of recently diagnosed diabetes and family     history of hyperlipidemia, the optimal goal for his LDL is less     than 70.     I have discussed with the patient about diet, exercise, and life style     changes to lose weight.  He has had a diabetic education and     dietitian education  from the last hospitalization and denied     further education during this admission.        We may consider to start statin if all of the conservative methods fail.  3. Class III morbid obesity.  The patient's BMI is 48 during this     admission.      Upon discharge,I have given him a new vial of NovoLog 70/30 insulin from [facility].  I have also given him     prescriptions for NovoLog 70/30, 50 units SQ twice a day and  metformin 1000 mg p.o. twice a day for him to filled out.  He will     need help from Gavin Pound about his [employer] card and MAP Program to     facilitate management of his diabetes, and I will appreciate     if [name] could follow him up to assist this young man      for better control of his diabetes.  DISCHARGE LABS AND VITALS:   T 98.1, HR 79, RR 15, BP 140/70. sodium 137, potassium 3.8, chloride 102, CO2 27, glucose 240, BUN 6, creatinine 0.79, and a calcium 9.3.   WBC 7.3, HB 13.7, and HCT 39.7, and platelets 196.    ______________________________ Dede Query, MD   ______________________________ Ileana Roup, M.D.    NL/MEDQ  D:  06/14/2011  T:  06/15/2011  Job:  [id number]  cc:   [facility]  Electronically Signed by Dede Query MD on 06/29/2011 09:24:50 PM Electronically Signed by Margarito Liner M.D. on 07/09/2011 02:46:04 PM

## 2011-07-13 ENCOUNTER — Encounter: Payer: Self-pay | Admitting: Dietician

## 2011-07-13 ENCOUNTER — Ambulatory Visit (INDEPENDENT_AMBULATORY_CARE_PROVIDER_SITE_OTHER): Payer: Self-pay | Admitting: Internal Medicine

## 2011-07-13 ENCOUNTER — Encounter: Payer: Self-pay | Admitting: Internal Medicine

## 2011-07-13 DIAGNOSIS — E119 Type 2 diabetes mellitus without complications: Secondary | ICD-10-CM

## 2011-07-13 DIAGNOSIS — Z23 Encounter for immunization: Secondary | ICD-10-CM

## 2011-07-13 DIAGNOSIS — Z0000 Encounter for general adult medical examination without abnormal findings: Secondary | ICD-10-CM | POA: Insufficient documentation

## 2011-07-13 MED ORDER — SIMVASTATIN 20 MG PO TABS: 20.0000 mg | ORAL_TABLET | Freq: Every evening | ORAL | Status: DC

## 2011-07-13 NOTE — Progress Notes (Signed)
History of present illness:  [name] is a 25 year old man with past medical history significant for diabetes mellitus and one previous episode of DKA. He was admitted from August 11 through the 14th for DKA.  Since his last clinic appointment [name] purchased a glucometer on line. He did not meet to attend his appointment with Brad Murphy.  As a result a glucometer that he purchased was very expensive and the strips were almost $200 to refill. He has only had minimal diabetes education as far and has not been yet today. Note, it is around 2 PM. Nor has he taken any medicines today.  He has not taken any blood glucose measurements. The last 2-1/2 weeks because he could not afford the glucometer strips. He is taking 50 units of insulin 70/30 twice a day with meals. He takes only 2 meals a day. He denies any symptoms of hypoglycemia and is able to verbalize what hypoglycemia would feel like. He does endorse some diarrhea when he takes metformin. He states that lisinopril is greatly reduced his incidence of headaches.   He also received the orange card which he would like to have activated today. He received a very large pill for his admission for DKA in August and needs to speak with Brad Murphy to see if we have the orange card retroactively cover this hospitalization.  Review of systems: Denies headache, nausea, confusion, vomiting, fevers, chills, shortness of breath, chest pain, abdominal pain, constipation, change in urination, diaphoresis, and dizziness. He does endorse mild area with metformin.  Exam:  General: resting in chair in NAD very tall obese male HEENT: PERRL, EOMI, no scleral icterus Cardiac: RRR, no rubs, murmurs or gallops Pulm: clear to auscultation bilaterally, moving normal volumes of air Abd: soft, nontender, nondistended, BS present Ext: warm and well perfused, trace pedal edema. Dorsalis pedis and posterior tibial pulses 2+ on the left foot and 1+ on the right foot. Edema  is slightly greater in right foot. Neuro: alert and oriented X3, cranial nerves II-XII grossly intact, moves all 4 extremities without difficulty. Sensation  grossly intact

## 2011-07-13 NOTE — Patient Instructions (Addendum)
Check you r blood sugar 4 times per day. Once before each meal and again 1.5 to 2 hours after each meal.  Try not to eat fried foods or drink soda, juice or sweet tea.   Bring all your medications to your next clinic appointment. Return to see Dr. Candy Sledge in clinic in on Sept 20 or 21st.

## 2011-07-13 NOTE — Assessment & Plan Note (Addendum)
Mr. Basnett has not been able to check his blood sugars so is unclear what his glucose has been during the past few weeks. Given this uncertainty and cannot change his medications at this time. Given that he had that he has the orange card we will write a new prescription for a glucometer and strips that will be provided for him through the orange card. The strips should be approximately $6 for 100 strips with his new glucometer. Mr. Ryant will get his new glucometer today we will have him return to clinic in approximately 1.5 weeks at which time he is to bring in a glucometer for download and we will determine the next step for his antihypoglycemic therapy. Mr. Patalano met with [name] today to discuss general diabetes management we will have him followup with her in 1.5 weeks as well.  Mr. Ernster is not at goal for cholesterol level in a patient with diabetes we will start simvastatin 20 mg daily which should be covered under the orange card.  -- Prescription for new glucometer and strips given -- Diabetic foot exam performed today -- We will refer him for diabetic eye exam at his next visit -- Simvastatin 20 started

## 2011-07-13 NOTE — Patient Instructions (Signed)
Make a follow up with Diabetes Educator in 2 weeks, then  monthly on same day you see doctor.

## 2011-07-13 NOTE — Assessment & Plan Note (Addendum)
Pneumovax and flu vaccine given today. Followup appointment in 1.5 weeks we will try to get Mr. Bloodgood connected with Mr. Jaynee Eagles to determine if his hospitalization can be covered under the orange card.

## 2011-07-13 NOTE — Progress Notes (Signed)
  Subjective:    Patient ID: Santiago Bur, male    DOB: April 02, 1986, 25 y.o.   MRN: [id number] HPI Review of Systems    Objective:   Physical Exam   Assessment & Plan:   Diabetes Self-Management Training (DSMT)  Initial Visit  07/13/2011 Mr. Coalton Arch, identified by name and date of birth, is a 25 y.o. male with Type 2 Diabetes. Year of diabetes diagnosis: July 2012 Other persons present: friend  ASSESSMENT Patient concerns are Glycemic control.  There were no vitals taken for this visit. There is no height or weight on file to calculate BMI. Lab Results  Component Value Date   LDLCALC 86 06/12/2011   Lab Results  Component Value Date   HGBA1C 12.7* 06/11/2011   Medication Nutrition Monitor: verio IQ  Labs reviewed.  DIABETES BUNDLE: A1C in past 6 months? Yes.  Less than 7%? No LDL in past year? Yes.  Less than 100 mg/dL? No Microalbumin ratio in past year? Yes. Blood pressure less than 130/80? Yes. Foot exam in last year? No. Sent note to MD. Eye exam in past year? No.  Reminded patient to schedule eye exam. Tobacco use? No. Pneumovax? No Flu vaccine? No Asprin? Yes  Family history of diabetes: Yes  Support systems: friends & Family Special needs: None  Prior DM Education: Yes   Medications See Medications list.  Not taking as prescribed. Says he was told to skip insulin when blood sugars are in target.  Patients belief/attitude about diabetes: Diabetes can be controlled.  Self foot exams daily: No  Diabetes Complications: None   Exercise Plan Doing ADLs  daily    Self-Monitoring Frequency of testing: not testing x 3 weeks Breakfast: 100 today with no meds or caloric intake  Hyperglycemia: Yes Weekly Hypoglycemia: No   Meal Planning Some knowledge   Assessment comments: patient had limited engagement today. Demonstrated good technique with insulin injection.     INDIVIDUAL DIABETES EDUCATION PLAN:  Diabetes disease  state Medication Monitoring _______________________________________________________________________  Intervention TOPICS COVERED TODAY:  Diabetes disease state patient self identified his Type of diabetes Medication  Taught/evaluated insulin injection, site rotation, insulin storage and needle disposal. Monitoring  Purpose and frequency of SMBG. Identified appropriate SMBG and A1C goals.  PATIENTS GOALS/PLAN (copy and paste in patient instructions so patient receives a copy): 1.  Learning Objective:       Know that diabetes is a lifelong disease 2.  Behavioral Objective:         Medications: To improve blood glucose levels, I will take my medication as prescribed Half of the time 50% Monitoring: To identify blood glucose trends, I will test my blood glucose 4 day Never 0%  Personalized Follow-Up Plan for Ongoing Self Management Support:  Doctor's Office, friends, family and CDE visits ______________________________________________________________________   Outcomes Expected outcomes: Demonstrated interest in learning.Expect positive changes in lifestyle.  Self-care Barriers: Lack of transportation, Lack of material resources, Coping skills  Education material provided: had packet called [employer] for control  Patient to contact team via Phone if problems or questions.  Time in: 1440     Time out: 1510  Future DSMT - 2 wks   Morghan Kester, Lupita Leash

## 2011-07-14 ENCOUNTER — Telehealth: Payer: Self-pay | Admitting: Internal Medicine

## 2011-07-14 NOTE — Telephone Encounter (Signed)
I called Brad Murphy to make sure that he was able to get his glucometer and statin. He states he was able to get glucometer from a $6 and strips. He stated he will get the statin tomorrow, but has not yet gotten it. I also urged him to set up an appointment with [name] on September 20 (which is when I will see him next) to further his diabetes education.

## 2011-07-14 NOTE — Progress Notes (Signed)
I agree with Dr. Raisch's assessment and plan.  

## 2011-07-21 ENCOUNTER — Ambulatory Visit (INDEPENDENT_AMBULATORY_CARE_PROVIDER_SITE_OTHER): Payer: Self-pay | Admitting: Internal Medicine

## 2011-07-21 ENCOUNTER — Encounter: Payer: Self-pay | Admitting: Internal Medicine

## 2011-07-21 DIAGNOSIS — E119 Type 2 diabetes mellitus without complications: Secondary | ICD-10-CM

## 2011-07-21 MED ORDER — INSULIN ASPART PROT & ASPART (70-30 MIX) 100 UNIT/ML ~~LOC~~ SUSP
50.0000 [IU] | Freq: Two times a day (BID) | SUBCUTANEOUS | Status: DC
Start: 2011-07-21 — End: 2011-09-12

## 2011-07-21 NOTE — Progress Notes (Signed)
Subjective:   Patient ID: Brad Murphy male   DOB: 1986/04/23 25 y.o.   MRN: [id number]  HPI: Brad Murphy is a 25 y.o. man with a recent diagnosis of DM2. He was hospitalized with DKA from 8/11-14.  He presents for 1 week follow-up from his first visit for diabetes.  Brad Murphy has gotten a new meter and checks his BG 4 times per day. Sometimes he will also check QHS.  He states that his average BG is 100s to 128.  He states his lowest BG was 48. He denies feeling hypoglycemic, but was unaware of the symptoms of hypoglycemia or what to do if he is hypoglycemic.  He is taking Novolog 70/30 fifty units BID and metformin 100 mg BID. He only eats late in the day. He does not eat breakfast. He has not yet started exercising or walking daily.  Otherwise he feels good. He does endorse some diarrhea with metformin use.  He has yet to fill prescription for simvastatin.   He saw his ophthalmologist yesterday and states that everything looked good.     Current Outpatient Prescriptions  Medication Sig Dispense Refill  . Blood Glucose Monitoring Suppl (WAVESENSE PRESTO) W/DEVICE KIT 1 each by Does not apply route QID.  1 each  0  . glucose blood test strip Use as instructed  100 each  12  . Lancets 30G MISC Use to check blood sugar 4- times/day. Code-250:00.  100 each  11  . lisinopril (PRINIVIL,ZESTRIL) 2.5 MG tablet Take 1 tablet (2.5 mg total) by mouth daily.  30 tablet  1  . metFORMIN (GLUCOPHAGE) 1000 MG tablet Take 1,000 mg by mouth 2 (two) times daily with a meal.        . simvastatin (ZOCOR) 20 MG tablet Take 1 tablet (20 mg total) by mouth every evening.  30 tablet  11    History   Social History  . Marital Status: Single    Spouse Name: N/A    Number of Children: N/A  . Years of Education: N/A   Social History Main Topics  . Smoking status: Current Some Day Smoker -- 0.3 packs/day    Types: Cigarettes    Last Attempt to Quit: 05/01/2011  . Smokeless tobacco: Not on file  .  Alcohol Use: No  . Drug Use: No  . Sexually Active: Not on file   Review of Systems: Constitutional: Denies fever, chills, diaphoresis, appetite change and fatigue.  Respiratory: Denies SOB, DOE,   Cardiovascular: Denies chest pain, palpitations and leg swelling.  Gastrointestinal: Denies nausea, vomiting, abdominal pain, Endorses some diarrhea. Musculoskeletal: Denies myalgias, back pain, joint swelling, arthralgias and gait problem. Endorses mild back pain when sitting for long periods. Neurological: Denies dizziness, seizures, syncope, weakness, light-headedness, numbness and headaches.   Objective:  Physical Exam: Filed Vitals:   07/21/11 1440  BP: 137/83  Pulse: 79  Temp: 98 F (36.7 C)  TempSrc: Oral  Height: 6\' 3"  (1.905 m)  Weight: 393 lb (178.264 kg)   Constitutional: Vital signs reviewed.  Patient is an obese male in no acute distress and cooperative with exam. Alert and oriented x3.  Head: Normocephalic and atraumatic Mouth: no erythema or exudates, MMM Eyes: PERRL, EOMI, conjunctivae normal, No scleral icterus.  Neck: Supple, Trachea midline, No JVD,  Cardiovascular: RRR, S1 normal, S2 normal, no MRG, pulses symmetric and intact bilaterally Pulmonary/Chest: CTAB, no wheezes, rales, or rhonchi Abdominal: Soft. Non-tender, non-distended, bowel sounds are normal, no masses, organomegaly, or guarding present.  Neurological: A&O x3,  cranial nerve II-XII are grossly intact, no focal motor deficit, sensory intact to light touch bilaterally.  Skin: Warm, dry and intact. No rash, cyanosis, or clubbing.  Psychiatric: Normal mood and affect. speech and behavior is normal. Judgment and thought content normal. Cognition and memory are normal.

## 2011-07-21 NOTE — Assessment & Plan Note (Addendum)
The purpose of today's visit was for Brad Murphy to bring his glucometer so that we could determine what his blood sugars have been running and further titrate his antihyperglycemic medications. However he did not bring this in today. Per his report he has not experienced any episodes of hypoglycemia and his blood sugars have been relatively well-controlled. I asked him to eat breakfast every day. He should eat something such as oatmeal and yogurt. I also asked him to get 30 minutes apart pumping exercise at least 4 times a week. I suggested joining an intramural sport team. We discussed symptoms of hypoglycemia and what to do if he is hypoglycemic. Briefly I told him to eat a small amount of candy such as 3 smarties rolls or 8 ounces of orange juice.  -- I will have him followup with Brad Murphy in one week to have him download his glucometer so that we can monitor his blood sugars.  -- I will have him followup with me in one month in the clinic. -- I will keep his NovoLog 70/30 SQ 50 units twice a day and metformin 1000 mg twice a day at the current doses. We will readdress his antihyperglycemic regimen in one month when we have a better idea of what his blood glucose has been. -- He is to go to a prescription for simvastatin filled today.

## 2011-07-21 NOTE — Patient Instructions (Addendum)
Make an appointment to see Lupita Leash [name] in 1 week the diabetes coordinator to review your Diabetes management.  Return to clinic to see Dr. Candy Sledge in 1 month  Please bring your glucometer to [facility] VISIT. This is very important.  Please bring all your medications to your next clinic appointment.    Hypoglycemia (Low Blood Sugar) Hypoglycemia is when the glucose (sugar) in your blood is too low. Hypoglycemia can happen for many reasons. It can happen to people with or without diabetes. Hypoglycemia can develop quickly and can be a medical emergency.  CAUSES Having hypoglycemia does not mean that you will develop diabetes. Different causes include:  Missed or delayed meals or not enough carbohydrates eaten.   Medication overdose. This could be by accident or deliberate. If by accident, your medication may need to be adjusted or changed.   Exercise or increased activity without adjustments in carbohydrates or medications.   A nerve disorder that affects body functions like your heart rate, blood pressure and digestion (autonomic neuropathy).   A condition where the stomach muscles do not function properly (gastroparesis). Therefore, medications may not absorb properly.   The inability to recognize the signs of hypoglycemia (hypoglycemic unawareness).   Absorption of insulin - may be altered.   Alcohol consumption.   Pregnancy/Menstrual cycles/Postpartum. This may be due to hormones.   Certain kinds of tumors. This is very rare.  SYMPTOMS  Sweaty.   Hungry.   Dizzy.   Blurred vision.   Drowsy.   Weak.   Headache.   Rapid heart beat.   Shaky.   Nervous.  DIAGNOSIS Diagnosis is made by monitoring blood glucose in one or all of the following ways:  Fingerstick blood glucose monitoring.   Laboratory results.  TREATMENT If you think your blood glucose is low:  Check your blood glucose, if possible. If it is less than 70 mg/dl, take one of the following:     3-4 glucose tablets.    cup juice (prefer clear like apple).    cup "regular" soda pop.   1 cup milk.   -1 tube of glucose gel.   5-6 hard candies.   Do not over treat because your blood glucose (sugar) will only go too high.   Wait 15 minutes and recheck your blood glucose. If it is still less than 70 mg/dl (or below your target range), repeat treatment.   Eat a snack if it is more than one hour until your next meal.  Treatment for severe hypoglycemia Sometimes, your blood glucose may go so low that you are unable to treat yourself. You may need someone to help you. You may even pass out or be unable to swallow. This may require you to get an injection of glucagon, which raises the blood glucose. HOME CARE INSTRUCTIONS/PREVENTION  Check blood glucose as recommended by caregiver.   Take medication as prescribed by caregiver.   Follow your meal plan. Do not skip meals. Eat on time.   If you are going to drink alcohol, drink it only with meals.   Check your blood glucose before driving.   Check your blood glucose before and after exercise. If you exercise longer or different than usual, be sure to check blood glucose more frequently.   Always carry treatment with you. Glucose tablets are the easiest to carry.   Always wear medical alert jewelry or carry some form of identification that states that you have diabetes. This will alert people that you have diabetes. If  you have hypoglycemia, they will have a better idea on what to do.  SEEK MEDICAL CARE IF:  You are having problems keeping your blood sugar at target range.   You are having frequent episodes of hypoglycemia.   You feel you might be having side effects from your medicines.   You have symptoms of an illness that is not improving after 3-4 days.   You notice a change in vision or a new problem with your vision.  SEEK IMMEDIATE MEDICAL CARE IF:  You are a family member or friend of a person whose blood  glucose goes below 70 mg/dl and is accompanied by:   Confusion.   A change in mental status.   The inability to swallow.   Passing out.  Document Released: 10/17/2005 Document Re-Released: 08/13/2009 [facility] Patient Information 2011 [place], [place].

## 2011-07-21 NOTE — Progress Notes (Deleted)
  Subjective:    Patient ID: Brad Murphy, male    DOB: 1986-03-11, 25 y.o.   MRN: [id number]  HPI    Review of Systems     Objective:   Physical Exam        Assessment & Plan:

## 2011-07-22 NOTE — Progress Notes (Signed)
I agree with Dr. Raisch's assessment and plan.  

## 2011-07-28 ENCOUNTER — Ambulatory Visit: Payer: Self-pay | Admitting: Dietician

## 2011-08-25 ENCOUNTER — Ambulatory Visit (INDEPENDENT_AMBULATORY_CARE_PROVIDER_SITE_OTHER): Payer: Self-pay | Admitting: Internal Medicine

## 2011-08-25 ENCOUNTER — Encounter: Payer: Self-pay | Admitting: Internal Medicine

## 2011-08-25 DIAGNOSIS — E119 Type 2 diabetes mellitus without complications: Secondary | ICD-10-CM

## 2011-08-25 MED ORDER — LISINOPRIL 10 MG PO TABS: 10.0000 mg | ORAL_TABLET | Freq: Every day | ORAL | Status: DC

## 2011-08-25 NOTE — Progress Notes (Signed)
Subjective:   Patient ID: Brad Murphy male   DOB: 02/28/1986 25 y.o.   MRN: [id number]  HPI: Brad Murphy is a 25 y.o. man who was recently diagnosed with diabetes type 2. He was hospitalized with DKA in August A1c from that time was 12.7. He presents today for evaluation of his diabetes and blood glucose control.  At his last visit, [name] did not bring his glucometer so no changes could be made to his anti-hyperglycemic regimen. Today he is brought in his glucometer. Your records past he has only checked his blood glucose 3 times in the last 2 weeks. He states that he has been checking his blood sugar twice daily before meals. When asked about the discrepancy, he states he sometimes uses the other glucometer that he purchased prior to joining our clinic. Fortunately the results we do have show that [name] glucose has been relatively well-controlled. On the 18th his blood glucose was 85, on the 17th his blood glucose was 139, on the 15th his blood glucose was 117. He is due to get A1c in one month which will confirm whether or not these encouraging results are exceptions or his new baseline.    [name] states that he has only had hypoglycemia once since his last visit he states he felt nauseated and sick and when he checked his blood glucose was 40. He drank some juice and this made him feel better. He states his highest value has been 160. He states he skips his insulin if his blood glucose is less than 85, or if he skips a meal. Overall he has not had any complications with insulin or metformin.  [name] has tried to decrease the amount of unrefined carbohydrates in his diet. He has discontinued drinking sugary drinks such as soda or sweet tea. He states he now drinks diet Kool-Aid or Sprite 0. He has not made progress eating breakfast. He often skips breakfast and today he had not eaten at his 3 PM visit. He has also not made progress with exercising. He has not tried to engage in  physical activity since his last visit. He does state that he got a dog and will begin walking the dog though he has not yet started. He has still not yet filled the prescription for statin.    No past medical history on file. Current Outpatient Prescriptions  Medication Sig Dispense Refill  . Blood Glucose Monitoring Suppl (WAVESENSE PRESTO) W/DEVICE KIT 1 each by Does not apply route QID.  1 each  0  . glucose blood test strip Use as instructed  100 each  12  . insulin aspart protamine-insulin aspart (NOVOLOG MIX 70/30) (70-30) 100 UNIT/ML injection Inject 50 Units into the skin 2 (two) times daily with a meal.  30 mL  12  . Lancets 30G MISC Use to check blood sugar 4- times/day. Code-250:00.  100 each  11  . metFORMIN (GLUCOPHAGE) 1000 MG tablet Take 1,000 mg by mouth 2 (two) times daily with a meal.        . DISCONTD: lisinopril (PRINIVIL,ZESTRIL) 2.5 MG tablet Take 1 tablet (2.5 mg total) by mouth daily.  30 tablet  1  . lisinopril (PRINIVIL,ZESTRIL) 10 MG tablet Take 1 tablet (10 mg total) by mouth daily.  30 tablet  11  . simvastatin (ZOCOR) 20 MG tablet Take 1 tablet (20 mg total) by mouth every evening.  30 tablet  11   No family history on file. History  Social History  . Marital Status: Single    Spouse Name: N/A    Number of Children: N/A  . Years of Education: N/A   Social History Main Topics  . Smoking status: Current Some Day Smoker -- 0.3 packs/day    Types: Cigarettes    Last Attempt to Quit: 05/01/2011  . Smokeless tobacco: None  . Alcohol Use: No  . Drug Use: No  . Sexually Active: Yes   Other Topics Concern  . None   Social History Narrative  . None   Review of Systems: Per HPI  Objective:  Physical Exam: Filed Vitals:   08/25/11 1446  BP: 142/85  Pulse: 79  Temp: 96.9 F (36.1 C)  TempSrc: Oral  Height: 6\' 3"  (1.905 m)  Weight: 390 lb 11.2 oz (177.22 kg)   Constitutional: Vital signs reviewed.  Patient is an obese man in no acute distress  and cooperative with exam. Alert and oriented x3.  Head: Normocephalic and atraumatic yes: PERRL, EOMI, conjunctivae normal, No scleral icterus.  Cardiovascular: RRR, S1 normal, S2 normal, no MRG, pulses symmetric and intact bilaterally Pulmonary/Chest: CTAB, no wheezes, rales, or rhonchi Abdominal: Soft. Non-tender, non-distended, bowel sounds are normal, no masses, organomegaly, or guarding present.  Musculoskeletal: Feet are clean with no ulcers, cuts or dermatophytes Hematology: no cervical, inginal, or axillary adenopathy.  Neurological: A&O x3, No focal neurologic deficit Skin: Warm, dry and intact. No rash, cyanosis, or clubbing.  Psychiatric: Normal mood and flattened affect. speech and behavior is normal. Judgment and thought content normal.

## 2011-08-25 NOTE — Progress Notes (Deleted)
  Subjective:    Patient ID: Brad Murphy, male    DOB: 07/11/1986, 25 y.o.   MRN: [id number]  HPI    Review of Systems     Objective:   Physical Exam        Assessment & Plan:   

## 2011-08-25 NOTE — Assessment & Plan Note (Signed)
[name] was good to bring in his glucometer today. From what I can tell based on the 3 values I have been from interrogating his glucometer further, it appears as though the current regimen is working. We will check A1c at his followup visit in one month. I did inform him that he should continue to take his insulin even when his blood glucose is normal. He is only to discontinue taking insulin when he skips a meal or if he is below a blood glucose of 70.   -- I again encouraged him to get any sort of physical activity into his daily routine and to continue to make changes to his diet.  -- Asked him to make an appointment with Tobey Bride. for the same day as his followup appointment in one month. -- We increased lisinopril to 10 mg by mouth daily.

## 2011-08-25 NOTE — Patient Instructions (Signed)
Make an appointment to see our diabetes coordinator Lupita Leash [name] on the same day as your follow up appointment for Diabetes management.  Return to clinic to see [facility] resident in [facility] November  Please bring your glucometer to [facility] VISIT. This is very important.  Please bring all your medications to your next clinic appointment.

## 2011-08-25 NOTE — Progress Notes (Signed)
I also received report from Dr. 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

## 2011-09-01 NOTE — Progress Notes (Signed)
agree with plans and note.

## 2011-09-12 ENCOUNTER — Ambulatory Visit (INDEPENDENT_AMBULATORY_CARE_PROVIDER_SITE_OTHER): Payer: Self-pay | Admitting: Dietician

## 2011-09-12 ENCOUNTER — Encounter: Payer: Self-pay | Admitting: Internal Medicine

## 2011-09-12 ENCOUNTER — Other Ambulatory Visit: Payer: Self-pay | Admitting: Internal Medicine

## 2011-09-12 ENCOUNTER — Ambulatory Visit (INDEPENDENT_AMBULATORY_CARE_PROVIDER_SITE_OTHER): Payer: Self-pay | Admitting: Internal Medicine

## 2011-09-12 DIAGNOSIS — E119 Type 2 diabetes mellitus without complications: Secondary | ICD-10-CM

## 2011-09-12 LAB — GLUCOSE, CAPILLARY: Glucose-Capillary: 126 mg/dL — ABNORMAL HIGH (ref 70–99)

## 2011-09-12 LAB — POCT GLYCOSYLATED HEMOGLOBIN (HGB A1C): Hemoglobin A1C: 6.4

## 2011-09-12 MED ORDER — INSULIN ASPART PROT & ASPART (70-30 MIX) 100 UNIT/ML ~~LOC~~ SUSP: 45.0000 [IU] | Freq: Two times a day (BID) | SUBCUTANEOUS | Status: DC

## 2011-09-12 MED ORDER — SIMVASTATIN 20 MG PO TABS: 20.0000 mg | ORAL_TABLET | Freq: Every evening | ORAL | Status: DC

## 2011-09-12 NOTE — Progress Notes (Signed)
  Subjective:    Patient ID: Brad Murphy, male    DOB: Jun 04, 1986, 25 y.o.   MRN: [id number]  HPI Brad Murphy is a 25 y.o. man who was recently diagnosed with diabetes type 2. He was hospitalized with DKA in August A1c from that time was 12.7. He presents today for evaluation of his diabetes and blood glucose control.    [name] did not bring his glucometer today but states he checks his sugars 4 times a day, and ranges between 95-130. He denies hypoglycemic symptoms such as dizziness or lightheadedness. Additionally he denies abdominal pain, nausea, vomiting, polyuria and cachexia. He has no other complaints or concerns today.  He reports he has increased his physical activity by walking his dog however he has not lost a substantial amount of weight yet.   Review of Systems  All other systems reviewed and are negative.       Objective:   Physical Exam  Constitutional: He appears well-developed.  HENT:  Head: Normocephalic.  Eyes: Pupils are equal, round, and reactive to light.  Neck: Normal range of motion.  Cardiovascular: Normal rate and regular rhythm.   Pulmonary/Chest: Effort normal.  Abdominal: Soft. Bowel sounds are normal.          Assessment & Plan:

## 2011-09-12 NOTE — Assessment & Plan Note (Addendum)
Lab Results  Component Value Date   HGBA1C 6.4 09/12/2011   HGBA1C 12.7* 06/11/2011   CREATININE 0.89 06/17/2011   CREATININE 0.79 06/14/2011   MICROALBUR 0.99 06/17/2011   MICRALBCREAT 5.3 06/17/2011   CHOL 166 06/12/2011   HDL 41 06/12/2011   TRIG 196* 06/12/2011    Last eye exam and foot exam: No results found for this basename: HMDIABEYEEXA, HMDIABFOOTEX  Eye exam normal  Checked 07/2011  Assessment: Diabetes control: controlled Progress toward goals: improved Barriers to meeting goals: no barriers identified  Plan: Diabetes treatment: continue current medications Reduce insulin to 45 units BID Refer to: diabetes educator for self-management training, diabetes educator for medical nutrition therapy and nutritionist Instruction/counseling given: reminded to get eye exam, reminded to bring blood glucose meter & log to each visit, reminded to bring medications to each visit, discussed foot care, discussed the need for weight loss and discussed diet

## 2011-09-12 NOTE — Assessment & Plan Note (Signed)
Patient states that he is trying to increase the level of aerobic exercise. Patient walks his dog and states that has been helping him with increasing his level of exercise. Patient also met up with Lupita Leash to discuss diet and lifestyle modification.

## 2011-09-12 NOTE — Patient Instructions (Signed)
Please follow up in 3 months.  Please take all medications as directed.

## 2011-09-12 NOTE — Progress Notes (Signed)
  Diabetes Self-Management Training (DSMT)  2nd Visit  09/12/2011 [name], identified by name and date of birth, is a 25 y.o. male with Type 2 Diabetes. Year of diabetes diagnosis: July 2012 Other persons present: friend- [place]  ASSESSMENT Patient concerns are Glycemic control.  Height 6\' 3"  (1.905 m), weight 394 lb 4.8 oz (178.853 kg). Body mass index is 49.28 kg/(m^2). Lab Results  Component Value Date   LDLCALC 86 06/12/2011   Lab Results  Component Value Date   HGBA1C 12.7* 06/11/2011   Medication Nutrition Monitor: verio IQ  Labs reviewed.  DIABETES BUNDLE: A1C in past 6 months? Yes.  Less than 7%? yes LDL in past year? Yes.  Less than 100 mg/dL? yes Microalbumin ratio in past year? Yes. Blood pressure less than 130/80? Yes Foot exam in last year? yes Eye exam in past year? yes Tobacco use? yes Pneumovax? yes Flu vaccine? yes Asprin? Yes  Family history of diabetes: Yes Support systems: friends & Family Special needs: None Prior DM Education: Yes Patients belief/attitude about diabetes: Diabetes can be controlled. Self foot exams daily: No Diabetes Complications: None   Medications See Medications list.  Not taking as prescribed. Not taking statin or correct dose of blood pressure medication. Says He skips insulin ~ twice a week when he misses breakfast. Reports metformin makes him use the bathroom more often, but this is tolerable and better than when he was in the hospital.   Exercise Plan Doing ADLs  daily    Self-Monitoring Frequency of testing: 2 x a day 95-100 before meals and 120-130 after meals reported  Hyperglycemia: no Hypoglycemia: yes, about 1x every other week   Meal Planning Some knowledge- could identify ~ 60% of foods with carbs.   Assessment comments: Patient seems appropriately concerned about this diabetes today. Not interested in quitting smoking or exercising. Watches > 8 hours of television per day   INDIVIDUAL  DIABETES EDUCATION PLAN:  Meal planning Medications Monitoring Acute and chronic complications _______________________________________________________________________  Intervention TOPICS COVERED TODAY:  Diabetes disease state patient self identified his Type of diabetes Medication  Taught/evaluated insulin injection, site rotation, insulin storage and needle disposal. Monitoring  Purpose and frequency of SMBG. Identified appropriate SMBG and A1C goals.  PATIENTS GOALS/PLAN (copy and paste in patient instructions so patient receives a copy): 1.  Learning Objective:       Know that diabetes is a lifelong disease 2.  Behavioral Objective:  Medications: To improve blood glucose levels, I will take my medication as prescribed Half of the time 75% Monitoring: To identify blood glucose trends, I will test my blood glucose 4 day Not appropriate changed to 2 times a day- patient did not bring meter today. Never 0% Chronic complications: Check feet daily- never 0%  Personalized Follow-Up Plan for Ongoing Self Management Support:  Doctor's Office, friends, family and CDE visits ______________________________________________________________________   Outcomes Expected outcomes: Demonstrated interest in learning.Expect positive changes in lifestyle.  Self-care Barriers: Lack of transportation, Lack of material resources, Coping skills  Education material provided: had packet called [employer] for control  Patient to contact team via Phone if problems or questions.  Time in: 1340     Time out: 1440  Future DSMT - 3 months   [name], Lupita Leash

## 2011-12-21 IMAGING — CR DG CHEST 1V PORT
2 series · 2 of 2 positions shown · non-contrast
Comparison: None.

CLINICAL DATA: 25-year-old male with hyperglycemia, diabetic
ketoacidosis.

PORTABLE CHEST - 1 VIEW

[AP (1 of 2)]
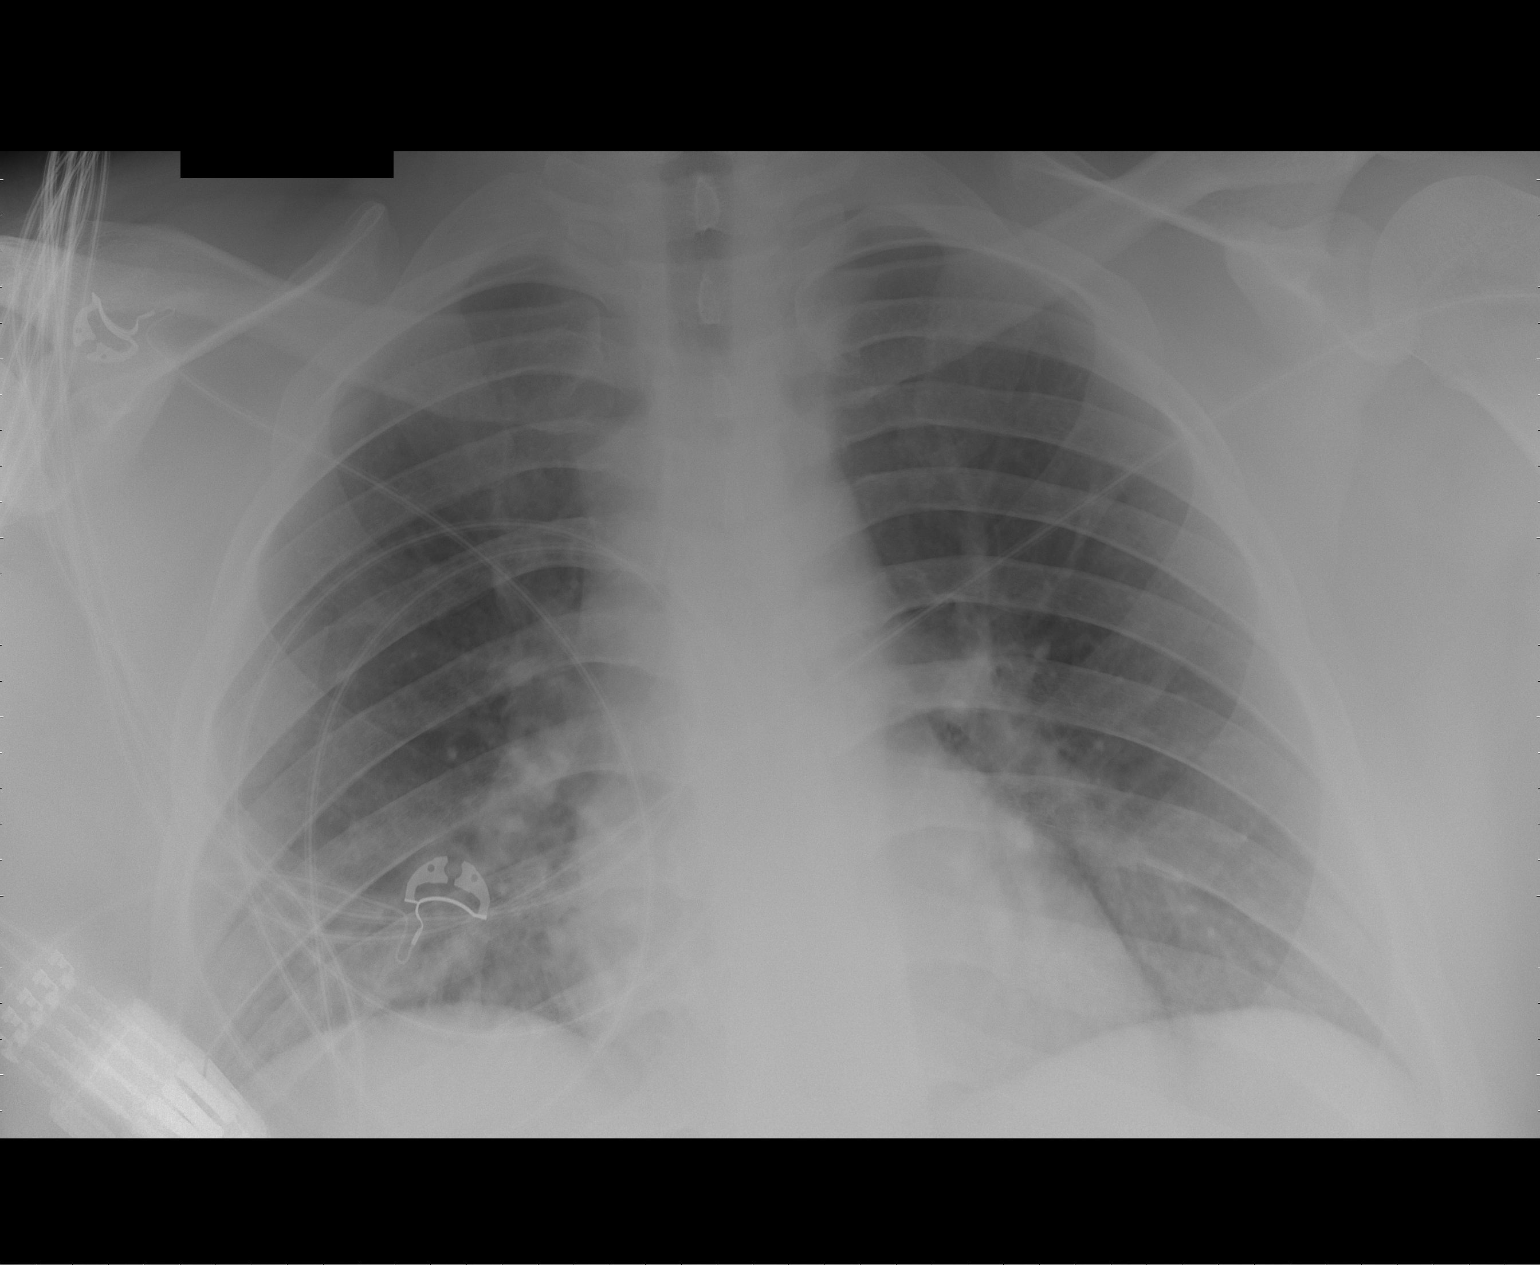

[AP (2 of 2)]
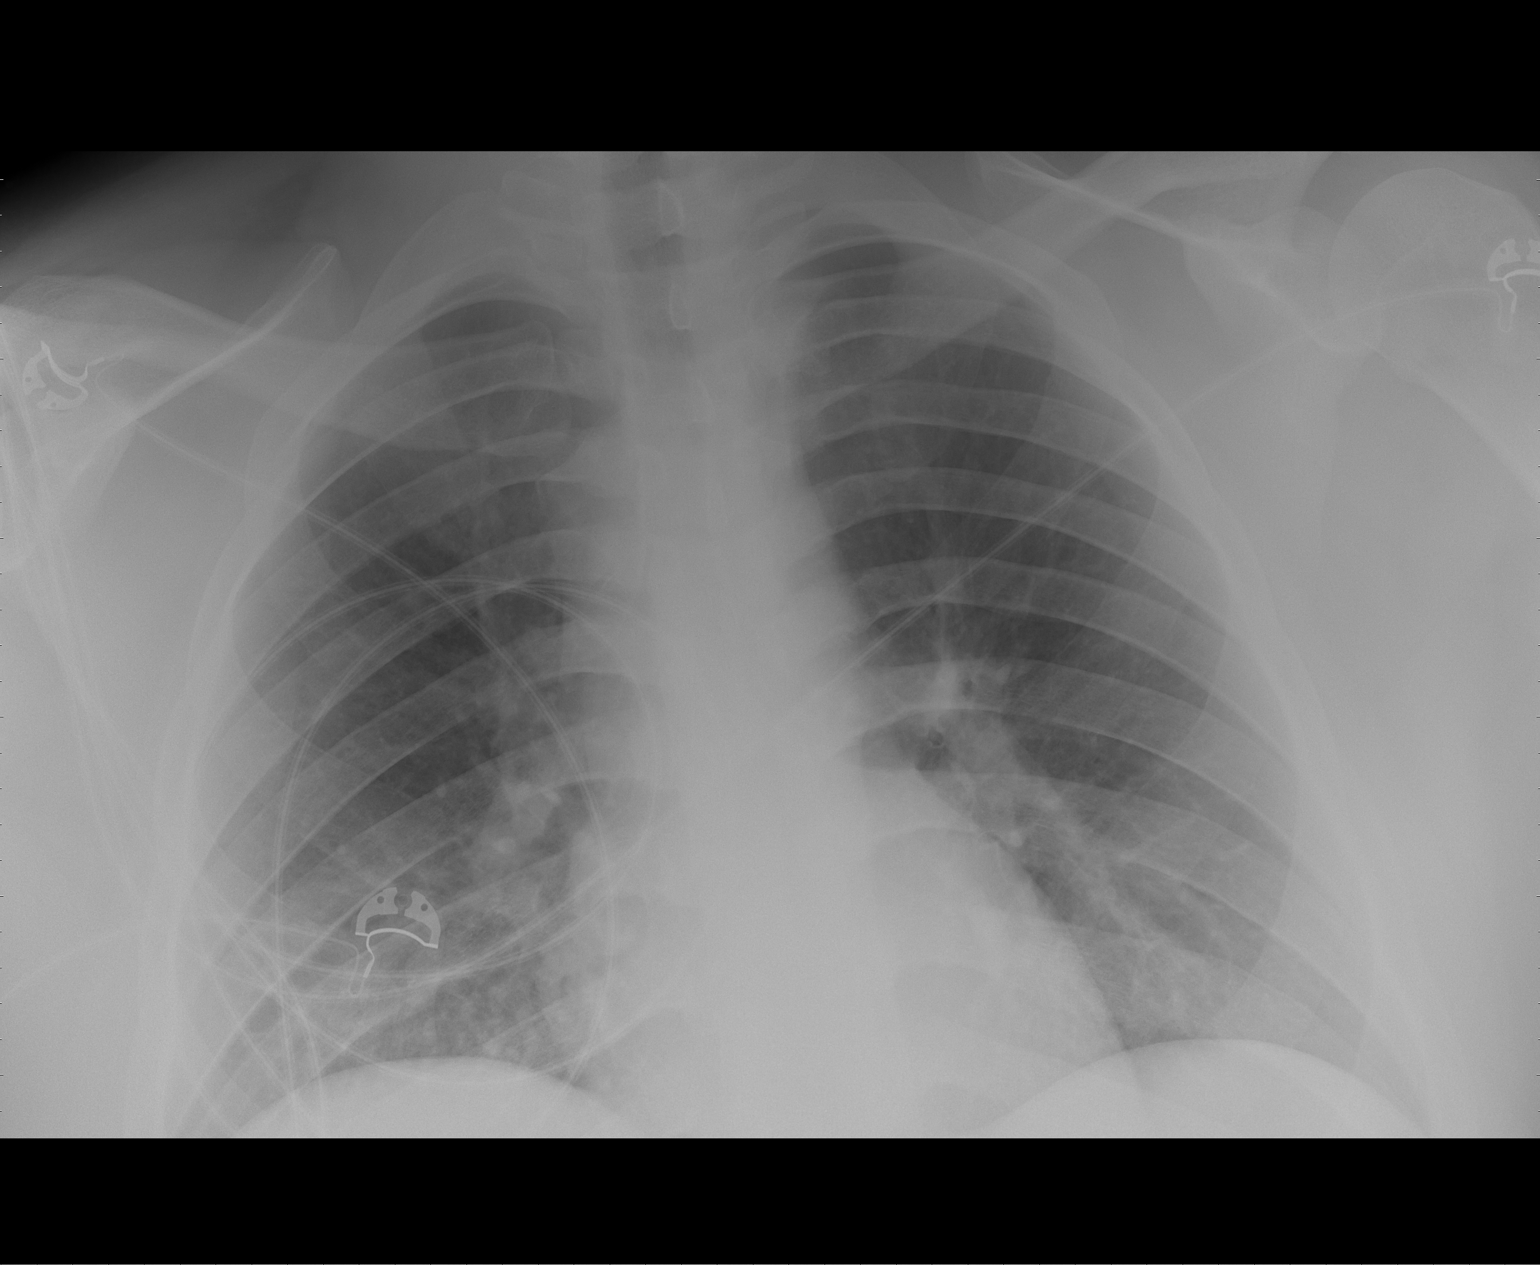

[2 of 2 positions shown; findings below may reference images not displayed]

FINDINGS: Portable semi upright AP views at 4555 hours.  Mildly
shallow lung volumes. Normal cardiac size and mediastinal contours.
Visualized tracheal air column is within normal limits.  Allowing
for portable technique, the lungs are clear.  No pneumothorax or
effusion.
IMPRESSION: Low lung volumes, otherwise no acute cardiopulmonary abnormality.

## 2011-12-22 IMAGING — US US RENAL
1 series · 14 of 18 positions shown · non-contrast
Comparison: None.

CLINICAL DATA: Acute renal failure.

RENAL/URINARY TRACT ULTRASOUND COMPLETE

[Series 1: us renal · 0.35mm/px · 14 of 18 slices shown]
[im 1/18]
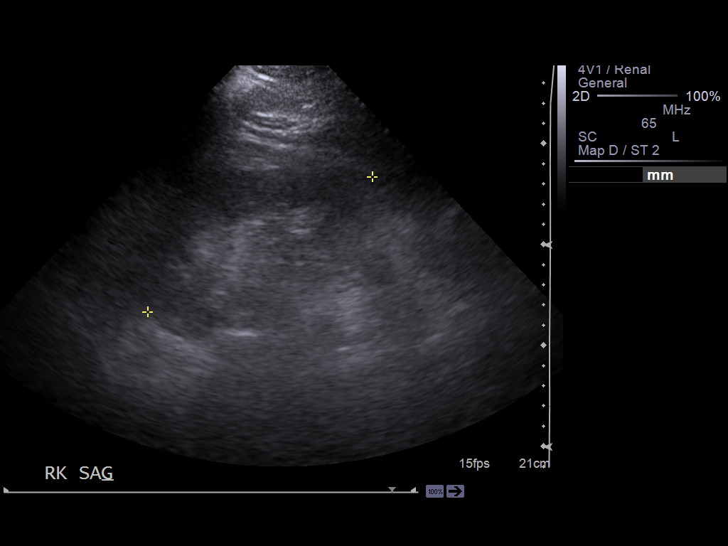
[im 2/18]
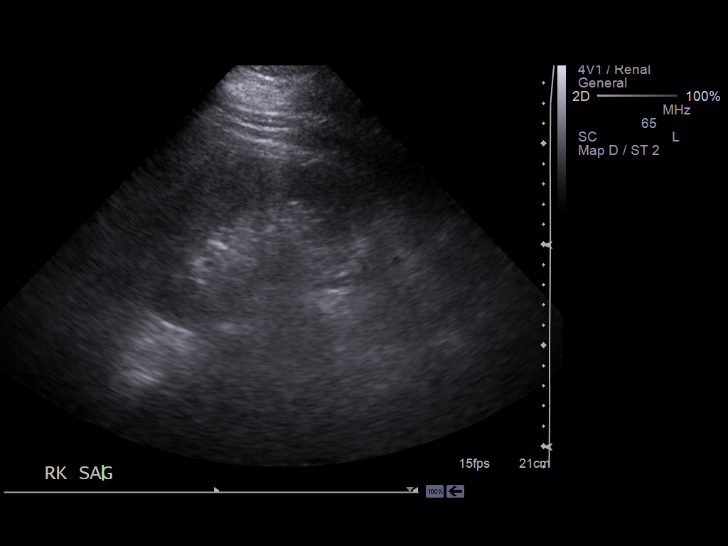
[im 4/18]
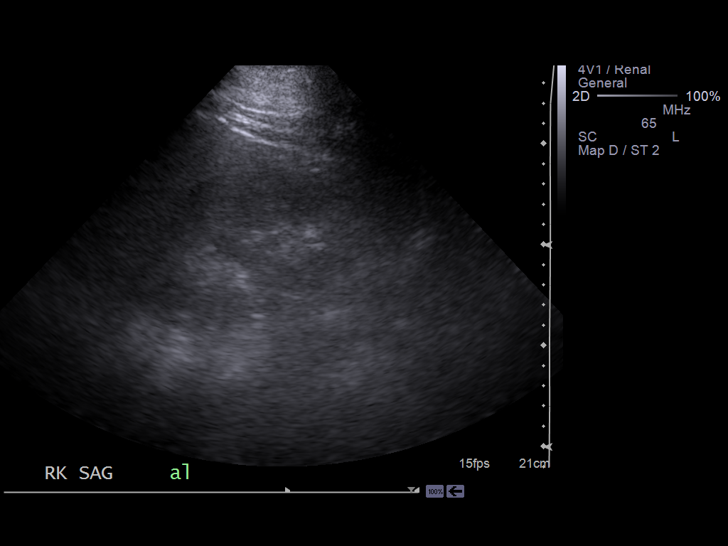
[im 5/18]
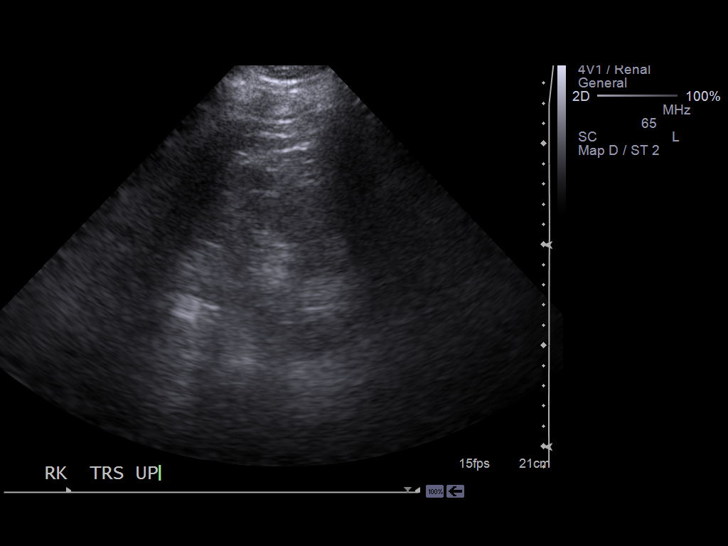
[im 6/18]
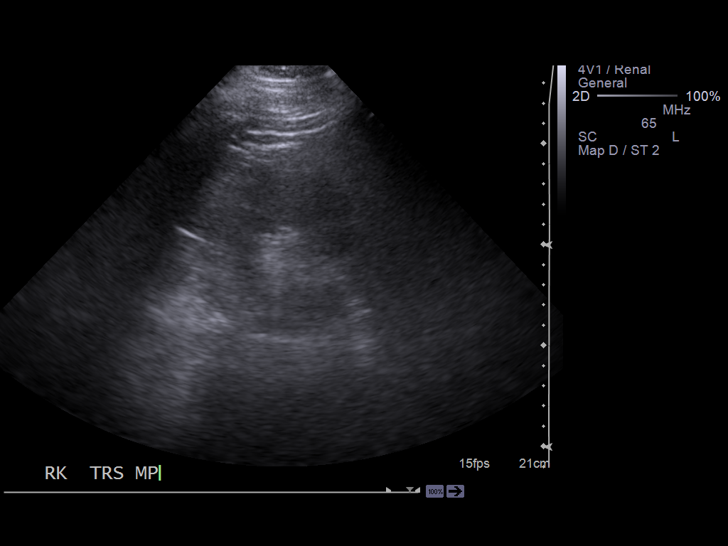
[im 8/18]
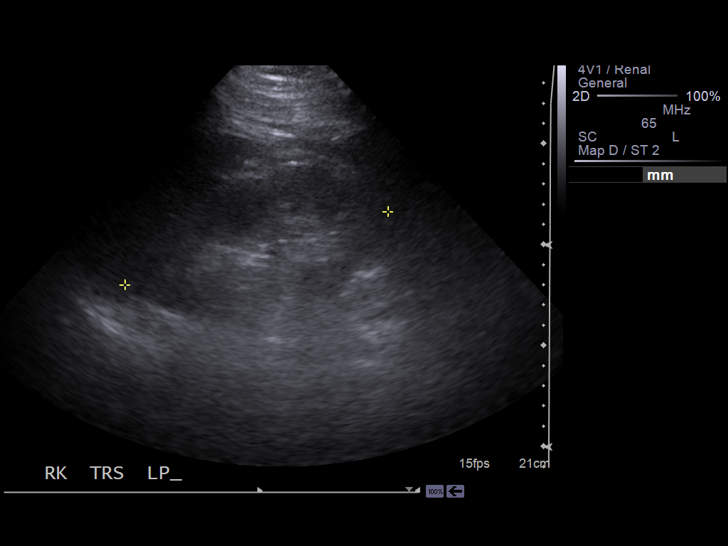
[im 9/18]
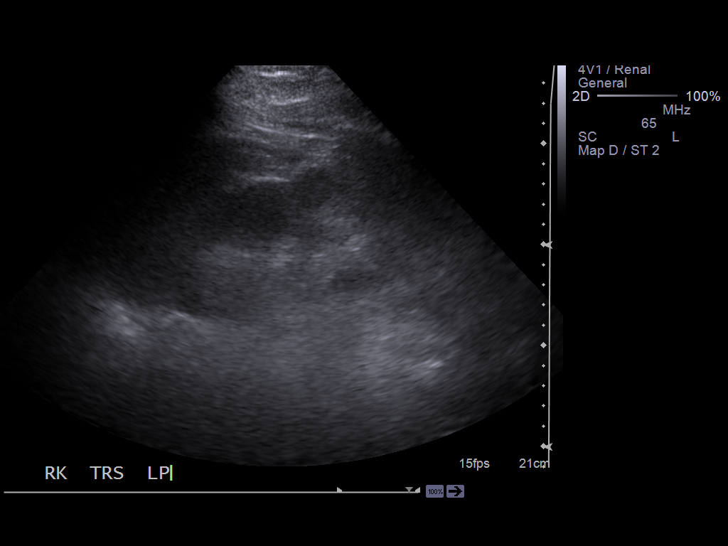
[im 10/18]
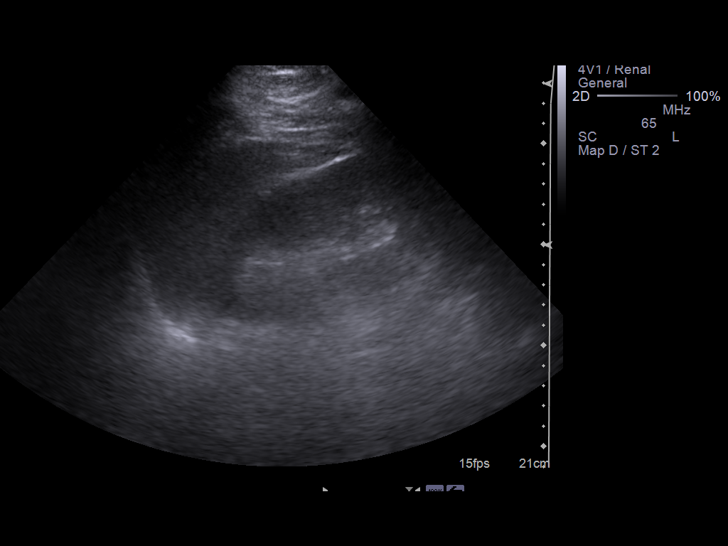
[im 11/18]
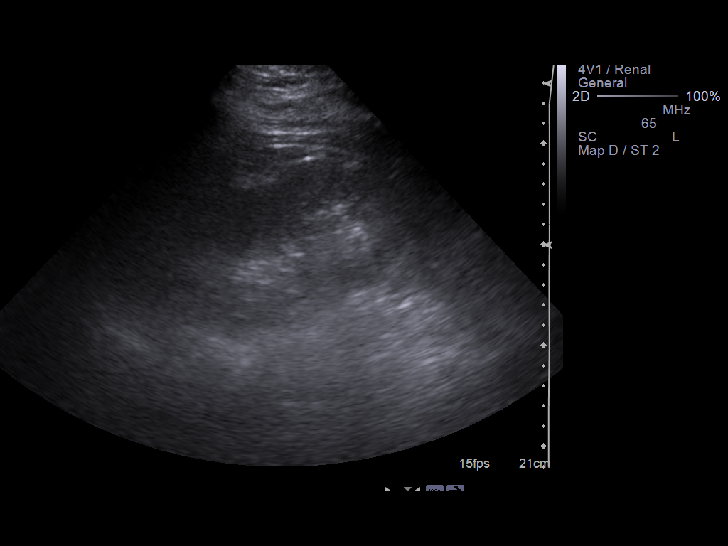
[im 13/18]
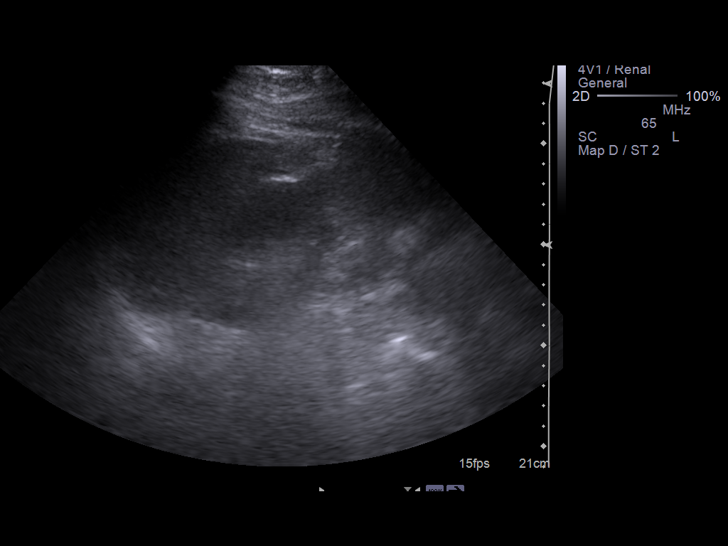
[im 14/18]
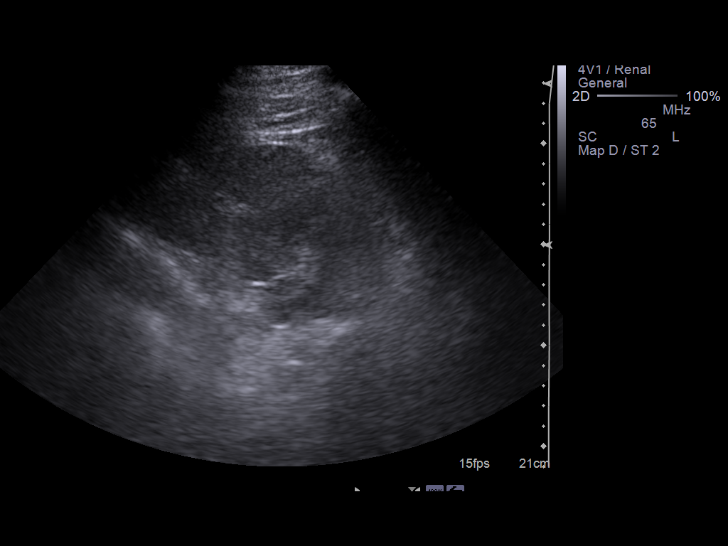
[im 15/18]
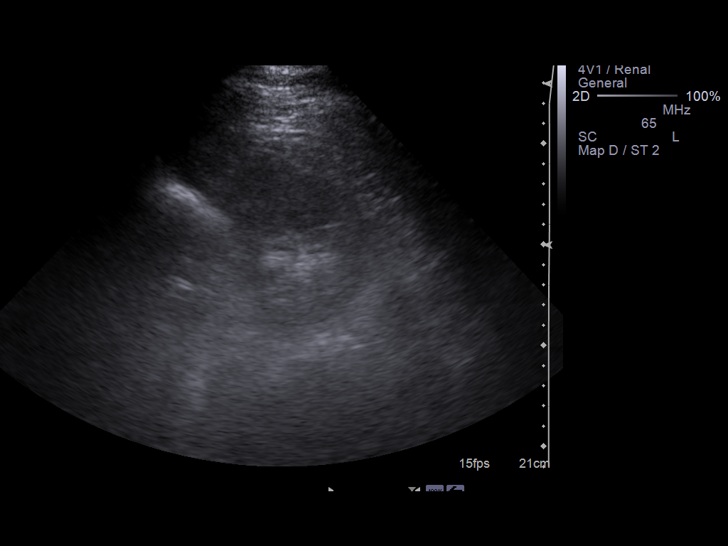
[im 17/18]
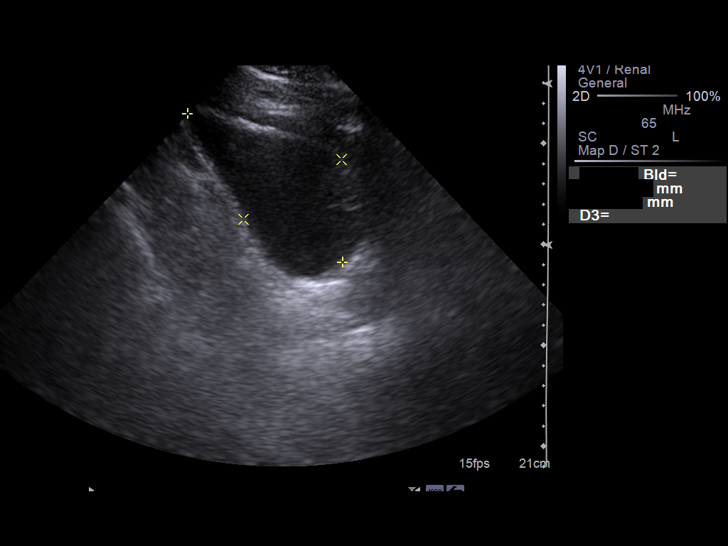
[im 18/18]
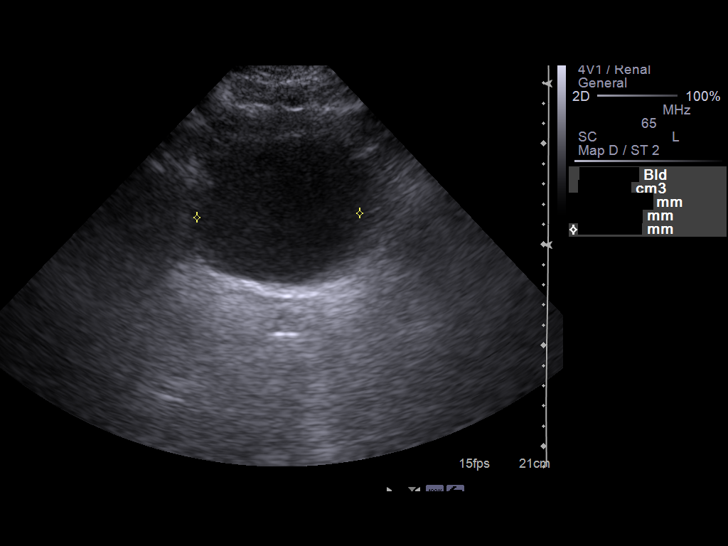

[14 of 18 positions shown; findings below may reference images not displayed]

FINDINGS: Right Kidney:  Right kidney measures approximately 13 cm in length.
Somewhat limited ultrasound penetration due to body habitus.  No
evidence of hydronephrosis or focal lesion by ultrasound.  No
significant atrophy.

Left Kidney:  The left kidney measures approximately 13.5 cm.
Somewhat limited visualization by ultrasound, but to no evidence of
hydronephrosis or obvious focal lesion.  No shadowing calculi
identified.

Bladder:  The bladder is mildly distended at the time of imaging
and unremarkable in appearance.
IMPRESSION: Unremarkable renal ultrasound.  Somewhat limited ultrasound
visualization, especially of the left kidney, due to body habitus.

## 2012-09-19 ENCOUNTER — Telehealth: Payer: Self-pay | Admitting: Dietician

## 2012-09-19 NOTE — Telephone Encounter (Signed)
CDE tried calling phone again with same message that it has been disconnected.

## 2012-12-19 ENCOUNTER — Encounter: Payer: Self-pay | Admitting: Internal Medicine

## 2012-12-26 ENCOUNTER — Telehealth: Payer: Self-pay | Admitting: Internal Medicine

## 2012-12-26 NOTE — Telephone Encounter (Signed)
Attempted to call Mr Kaspar' mother, which is the only contact in epic, but number is disconnected. We will try to send a letter.

## 2013-05-09 ENCOUNTER — Other Ambulatory Visit: Payer: Self-pay

## 2014-01-08 ENCOUNTER — Inpatient Hospital Stay (HOSPITAL_COMMUNITY)
Admission: EM | Admit: 2014-01-08 | Discharge: 2014-01-13 | DRG: 638 | Disposition: A | Discharge status: Home / Self Care | Payer: Self-pay | Attending: Internal Medicine | Admitting: Internal Medicine

## 2014-01-08 ENCOUNTER — Encounter (HOSPITAL_COMMUNITY): Payer: Self-pay | Admitting: Emergency Medicine

## 2014-01-08 DIAGNOSIS — E1310 Other specified diabetes mellitus with ketoacidosis without coma: Principal | ICD-10-CM | POA: Diagnosis present

## 2014-01-08 DIAGNOSIS — E1110 Type 2 diabetes mellitus with ketoacidosis without coma: Secondary | ICD-10-CM | POA: Diagnosis present

## 2014-01-08 DIAGNOSIS — I10 Essential (primary) hypertension: Secondary | ICD-10-CM | POA: Diagnosis present

## 2014-01-08 DIAGNOSIS — Z6841 Body Mass Index (BMI) 40.0 and over, adult: Secondary | ICD-10-CM

## 2014-01-08 DIAGNOSIS — E1165 Type 2 diabetes mellitus with hyperglycemia: Secondary | ICD-10-CM | POA: Diagnosis present

## 2014-01-08 DIAGNOSIS — F1210 Cannabis abuse, uncomplicated: Secondary | ICD-10-CM | POA: Diagnosis present

## 2014-01-08 DIAGNOSIS — F17200 Nicotine dependence, unspecified, uncomplicated: Secondary | ICD-10-CM | POA: Diagnosis present

## 2014-01-08 DIAGNOSIS — R9431 Abnormal electrocardiogram [ECG] [EKG]: Secondary | ICD-10-CM | POA: Diagnosis present

## 2014-01-08 DIAGNOSIS — E119 Type 2 diabetes mellitus without complications: Secondary | ICD-10-CM

## 2014-01-08 DIAGNOSIS — E861 Hypovolemia: Secondary | ICD-10-CM | POA: Diagnosis present

## 2014-01-08 DIAGNOSIS — E870 Hyperosmolality and hypernatremia: Secondary | ICD-10-CM | POA: Diagnosis present

## 2014-01-08 DIAGNOSIS — E785 Hyperlipidemia, unspecified: Secondary | ICD-10-CM | POA: Diagnosis present

## 2014-01-08 DIAGNOSIS — IMO0002 Reserved for concepts with insufficient information to code with codable children: Secondary | ICD-10-CM | POA: Diagnosis present

## 2014-01-08 DIAGNOSIS — E876 Hypokalemia: Secondary | ICD-10-CM | POA: Diagnosis not present

## 2014-01-08 LAB — I-STAT VENOUS BLOOD GAS, ED
Acid-base deficit: 1 mmol/L (ref 0.0–2.0)
Bicarbonate: 24.4 mEq/L — ABNORMAL HIGH (ref 20.0–24.0)
O2 Saturation: 76 %
PO2 VEN: 43 mmHg (ref 30.0–45.0)
TCO2: 26 mmol/L (ref 0–100)
pCO2, Ven: 43.2 mmHg — ABNORMAL LOW (ref 45.0–50.0)
pH, Ven: 7.359 — ABNORMAL HIGH (ref 7.250–7.300)

## 2014-01-08 LAB — URINALYSIS, ROUTINE W REFLEX MICROSCOPIC
BILIRUBIN URINE: NEGATIVE
HGB URINE DIPSTICK: NEGATIVE
KETONES UR: 15 mg/dL — AB
LEUKOCYTES UA: NEGATIVE
Nitrite: NEGATIVE
PH: 5.5 (ref 5.0–8.0)
Protein, ur: NEGATIVE mg/dL
Specific Gravity, Urine: <1.005 — ABNORMAL LOW (ref 1.005–1.030)
Urobilinogen, UA: 0.2 mg/dL (ref 0.0–1.0)

## 2014-01-08 LAB — CBG MONITORING, ED: Glucose-Capillary: >600 mg/dL (ref 70–99)

## 2014-01-08 LAB — URINE MICROSCOPIC-ADD ON

## 2014-01-08 MED ORDER — SODIUM CHLORIDE 0.9 % IV SOLN
1000.0000 mL | Freq: Once | INTRAVENOUS | Status: AC
  Administered 2014-01-08: 1000 mL via INTRAVENOUS

## 2014-01-08 MED ORDER — SODIUM CHLORIDE 0.9 % IV SOLN
INTRAVENOUS | Status: DC
  Administered 2014-01-09: 5.4 [IU]/h via INTRAVENOUS
  Administered 2014-01-10: 11.6 [IU]/h via INTRAVENOUS
  Administered 2014-01-10: 9.6 [IU]/h via INTRAVENOUS
  Administered 2014-01-10: 10.4 [IU]/h via INTRAVENOUS
  Administered 2014-01-10: 11.7 [IU]/h via INTRAVENOUS
  Administered 2014-01-11: via INTRAVENOUS
  Administered 2014-01-11: 5.4 [IU]/h via INTRAVENOUS
  Filled 2014-01-08 (×7): qty 1

## 2014-01-08 MED ORDER — SODIUM CHLORIDE 0.9 % IV BOLUS (SEPSIS): 1000.0000 mL | Freq: Once | INTRAVENOUS | Status: DC

## 2014-01-08 MED ORDER — DEXTROSE-NACL 5-0.45 % IV SOLN
INTRAVENOUS | Status: DC
  Administered 2014-01-09: 75 mL/h via INTRAVENOUS

## 2014-01-08 MED ORDER — SODIUM CHLORIDE 0.9 % IV SOLN
1000.0000 mL | INTRAVENOUS | Status: DC
  Administered 2014-01-09: 1000 mL via INTRAVENOUS

## 2014-01-08 NOTE — ED Notes (Signed)
The pt is c/o feeling weak all weakend.  He was takne off insulin [place] [facility] in [facility] due to his [place].  2 hours ago he cgecked his sugar and he thought it read 23.  When he arrived here after eating a meal at home his sugar is now 600.  He has had weakness blurred vision feeling tired  And he has felt like this for the past 4 days

## 2014-01-08 NOTE — ED Notes (Signed)
Patient's blood sugar exceeded measure of the glucometer. Over 600

## 2014-01-08 NOTE — ED Provider Notes (Signed)
CSN: [id number]     Arrival date & time 01/08/14  2048 History   First MD Initiated Contact with Patient 01/08/14 2301     Chief Complaint  Patient presents with  . Hyperglycemia   (Consider location/radiation/quality/duration/timing/severity/associated sxs/prior Treatment) Patient is a 28 y.o. male presenting with hyperglycemia. The history is provided by the patient and medical records.  Hyperglycemia Associated symptoms: increased thirst and polyuria    This is a 28 year old male with past medical history significant for hypertension, diabetes, presenting to the ED for polydipsia, polyuria, and blurred vision over the past several days.  States these are the same symptoms he had when he was first diagnosed with diabetes. For the past several years, patient has been incarcerated. States during this time he lost a great deal of weight, and his blood sugar was dropping too low when he was given his insulin before his only taking his metformin.  Since returning home, he did not restart his insulin. He checked his blood sugar earlier today, states meter read 123.  States he ate a meal and then sugar jumped to 600.  Patient denies any abdominal pain, nausea, vomiting, or diarrhea. He does endorse decreased appetite.  VS stable on arrival.  Past Medical History  Diagnosis Date  . Diabetes mellitus July 2012    diagnosed after DKA in July 2012  . HTN (hypertension)    History reviewed. No pertinent past surgical history. Family History  Problem Relation Age of Onset  . Hypertension Mother    History  Substance Use Topics  . Smoking status: Current Some Day Smoker -- 0.30 packs/day    Types: Cigarettes    Last Attempt to Quit: 05/01/2011  . Smokeless tobacco: Not on file  . Alcohol Use: No    Review of Systems  Eyes: Positive for visual disturbance.  Endocrine: Positive for polydipsia and polyuria.  All other systems reviewed and are negative.   Allergies  Bee venom  Home  Medications   Current Outpatient Rx  Name  Route  Sig  Dispense  Refill  . Blood Glucose Monitoring Suppl (WAVESENSE PRESTO) W/DEVICE KIT   Does not apply   1 each by Does not apply route QID.   1 each   0   . EXPIRED: insulin aspart protamine-insulin aspart (NOVOLOG MIX 70/30) (70-30) 100 UNIT/ML injection   Subcutaneous   Inject 45 Units into the skin 2 (two) times daily with a meal.   30 mL   12   . Lancets 30G MISC      Use to check blood sugar 4- times/day. Code-250:00.   100 each   11   . EXPIRED: lisinopril (PRINIVIL,ZESTRIL) 10 MG tablet   Oral   Take 1 tablet (10 mg total) by mouth daily.   30 tablet   11   . metFORMIN (GLUCOPHAGE) 1000 MG tablet   Oral   Take 1,000 mg by mouth 2 (two) times daily with a meal.           . EXPIRED: simvastatin (ZOCOR) 20 MG tablet   Oral   Take 1 tablet (20 mg total) by mouth every evening.   30 tablet   11    BP 159/90  Pulse 97  Temp(Src) 98.9 F (37.2 C) (Oral)  Resp 27  SpO2 96%  Physical Exam  Nursing note and vitals reviewed. Constitutional: He is oriented to person, place, and time. He appears well-developed and well-nourished. No distress.  Morbidly obese  HENT:  Head: Normocephalic  and atraumatic.  Mouth/Throat: Oropharynx is clear and moist.  Dry mucous membranes  Eyes: Conjunctivae and EOM are normal. Pupils are equal, round, and reactive to light.  Neck: Normal range of motion. Neck supple.  Cardiovascular: Normal rate, regular rhythm and normal heart sounds.   Pulmonary/Chest: Effort normal and breath sounds normal. No respiratory distress. He has no wheezes.  Abdominal: Soft. Bowel sounds are normal. There is no tenderness.  Musculoskeletal: Normal range of motion. He exhibits no edema.  Neurological: He is alert and oriented to person, place, and time. He has normal strength. He displays no tremor. No cranial nerve deficit or sensory deficit. He displays no seizure activity.  Skin: Skin is warm and  dry. He is not diaphoretic.  Psychiatric: He has a normal mood and affect.    ED Course  Procedures (including critical care time)  CRITICAL CARE Performed by: Larene Pickett   Total critical care time: 30  Critical care time was exclusive of separately billable procedures and treating other patients.  Critical care was necessary to treat or prevent imminent or life-threatening deterioration.  Critical care was time spent personally by me on the following activities: development of treatment plan with patient and/or surrogate as well as nursing, discussions with consultants, evaluation of patient's response to treatment, examination of patient, obtaining history from patient or surrogate, ordering and performing treatments and interventions, ordering and review of laboratory studies, ordering and review of radiographic studies, pulse oximetry and re-evaluation of patient's condition.  Medications  0.9 %  sodium chloride infusion (0 mLs Intravenous Stopped 01/09/14 0127)    Followed by  0.9 %  sodium chloride infusion (1,000 mLs Intravenous New Bag/Given 01/08/14 2325)    Followed by  0.9 %  sodium chloride infusion (not administered)  dextrose 5 %-0.45 % sodium chloride infusion (not administered)  insulin regular (NOVOLIN R,HUMULIN R) 1 Units/mL in sodium chloride 0.9 % 100 mL infusion (5.4 Units/hr Intravenous New Bag/Given 01/09/14 0012)  ondansetron (ZOFRAN) injection 4 mg (not administered)     Labs Review Labs Reviewed  URINALYSIS, ROUTINE W REFLEX MICROSCOPIC - Abnormal; Notable for the following:    Color, Urine STRAW (*)    Specific Gravity, Urine <1.005 (*)    Glucose, UA >1000 (*)    Ketones, ur 15 (*)    All other components within normal limits  COMPREHENSIVE METABOLIC PANEL - Abnormal; Notable for the following:    Sodium 132 (*)    Chloride 88 (*)    Glucose, Bld 1210 (*)    All other components within normal limits  CBG MONITORING, ED - Abnormal; Notable for  the following:    Glucose-Capillary >600 (*)    All other components within normal limits  I-STAT VENOUS BLOOD GAS, ED - Abnormal; Notable for the following:    pH, Ven 7.359 (*)    pCO2, Ven 43.2 (*)    Bicarbonate 24.4 (*)    All other components within normal limits  URINE MICROSCOPIC-ADD ON  CBC WITH DIFFERENTIAL  CBC WITH DIFFERENTIAL  BLOOD GAS, VENOUS  CBG MONITORING, ED   Imaging Review No results found.   EKG Interpretation None      MDM   Final diagnoses:  DKA (diabetic ketoacidoses)   Labs as above, elevated anion gap at 25, glucose 1210.  U/a with small ketones.  VBG WNL.  Pt started on glucose stabilizer, will be admitted for further management.   Spoke with IM resident, will admit to step down.  Temp  admission orders placed, VS remain stable.  Larene Pickett, PA-C 01/09/14 0130

## 2014-01-08 NOTE — ED Notes (Signed)
No answer from pt in waiting room 

## 2014-01-09 ENCOUNTER — Encounter (HOSPITAL_COMMUNITY): Payer: Self-pay | Admitting: Internal Medicine

## 2014-01-09 DIAGNOSIS — R9431 Abnormal electrocardiogram [ECG] [EKG]: Secondary | ICD-10-CM | POA: Diagnosis present

## 2014-01-09 DIAGNOSIS — E1110 Type 2 diabetes mellitus with ketoacidosis without coma: Secondary | ICD-10-CM | POA: Diagnosis present

## 2014-01-09 LAB — BASIC METABOLIC PANEL
BUN: 17 mg/dL (ref 6–23)
BUN: 14 mg/dL (ref 6–23)
BUN: 13 mg/dL (ref 6–23)
BUN: 13 mg/dL (ref 6–23)
BUN: 12 mg/dL (ref 6–23)
CALCIUM: 10.5 mg/dL (ref 8.4–10.5)
CHLORIDE: 110 meq/L (ref 96–112)
CHLORIDE: 110 meq/L (ref 96–112)
CO2: 22 mEq/L (ref 19–32)
CO2: 25 meq/L (ref 19–32)
CO2: 27 meq/L (ref 19–32)
CO2: 25 mEq/L (ref 19–32)
CO2: 25 mEq/L (ref 19–32)
Calcium: 10.6 mg/dL — ABNORMAL HIGH (ref 8.4–10.5)
Calcium: 10.3 mg/dL (ref 8.4–10.5)
Calcium: 10 mg/dL (ref 8.4–10.5)
Calcium: 9.7 mg/dL (ref 8.4–10.5)
Chloride: 106 mEq/L (ref 96–112)
Chloride: 113 mEq/L — ABNORMAL HIGH (ref 96–112)
Chloride: 113 mEq/L — ABNORMAL HIGH (ref 96–112)
Creatinine, Ser: 1.12 mg/dL (ref 0.50–1.35)
Creatinine, Ser: 1.1 mg/dL (ref 0.50–1.35)
Creatinine, Ser: 1.14 mg/dL (ref 0.50–1.35)
Creatinine, Ser: 1.12 mg/dL (ref 0.50–1.35)
Creatinine, Ser: 1.16 mg/dL (ref 0.50–1.35)
GFR calc Af Amer: >90 mL/min (ref >90)
GFR calc Af Amer: >90 mL/min (ref >90)
GFR calc Af Amer: >90 mL/min (ref >90)
GFR calc non Af Amer: 89 mL/min — ABNORMAL LOW (ref >90)
GFR calc non Af Amer: >90 mL/min (ref >90)
GFR calc non Af Amer: 88 mL/min — ABNORMAL LOW (ref >90)
GFR calc non Af Amer: 89 mL/min — ABNORMAL LOW (ref >90)
GFR calc non Af Amer: 86 mL/min — ABNORMAL LOW (ref >90)
GLUCOSE: 288 mg/dL — AB (ref 70–99)
Glucose, Bld: 577 mg/dL (ref 70–99)
Glucose, Bld: 352 mg/dL — ABNORMAL HIGH (ref 70–99)
Glucose, Bld: 258 mg/dL — ABNORMAL HIGH (ref 70–99)
Glucose, Bld: 143 mg/dL — ABNORMAL HIGH (ref 70–99)
POTASSIUM: 4.5 meq/L (ref 3.7–5.3)
POTASSIUM: 4.1 meq/L (ref 3.7–5.3)
POTASSIUM: 3.6 meq/L — AB (ref 3.7–5.3)
Potassium: 4.4 mEq/L (ref 3.7–5.3)
Potassium: 4 mEq/L (ref 3.7–5.3)
SODIUM: 150 meq/L — AB (ref 137–147)
SODIUM: 153 meq/L — AB (ref 137–147)
SODIUM: 153 meq/L — AB (ref 137–147)
Sodium: 153 mEq/L — ABNORMAL HIGH (ref 137–147)
Sodium: 152 mEq/L — ABNORMAL HIGH (ref 137–147)

## 2014-01-09 LAB — GLUCOSE, CAPILLARY
GLUCOSE-CAPILLARY: 314 mg/dL — AB (ref 70–99)
Glucose-Capillary: 224 mg/dL — ABNORMAL HIGH (ref 70–99)
Glucose-Capillary: 215 mg/dL — ABNORMAL HIGH (ref 70–99)

## 2014-01-09 LAB — BASIC METABOLIC PANEL WITH GFR
BUN: 13 mg/dL (ref 6–23)
CO2: 25 meq/L (ref 19–32)
Calcium: 10 mg/dL (ref 8.4–10.5)
Chloride: 110 meq/L (ref 96–112)
Creatinine, Ser: 1.13 mg/dL (ref 0.50–1.35)
GFR calc Af Amer: >90 mL/min
GFR calc non Af Amer: 88 mL/min — ABNORMAL LOW
Glucose, Bld: 214 mg/dL — ABNORMAL HIGH (ref 70–99)
Potassium: 3.8 meq/L (ref 3.7–5.3)
Sodium: 153 meq/L — ABNORMAL HIGH (ref 137–147)

## 2014-01-09 LAB — CBC WITH DIFFERENTIAL/PLATELET
Basophils Absolute: 0 10*3/uL (ref 0.0–0.1)
Basophils Relative: 0 % (ref 0–1)
Eosinophils Absolute: 0 10*3/uL (ref 0.0–0.7)
Eosinophils Relative: 0 % (ref 0–5)
HEMATOCRIT: 45.5 % (ref 39.0–52.0)
Hemoglobin: 16.7 g/dL (ref 13.0–17.0)
LYMPHS PCT: 11 % — AB (ref 12–46)
Lymphs Abs: 1.6 10*3/uL (ref 0.7–4.0)
MCH: 28.8 pg (ref 26.0–34.0)
MCHC: 36.7 g/dL — ABNORMAL HIGH (ref 30.0–36.0)
MCV: 78.6 fL (ref 78.0–100.0)
Monocytes Absolute: 0.7 10*3/uL (ref 0.1–1.0)
Monocytes Relative: 5 % (ref 3–12)
NEUTROS ABS: 11.6 10*3/uL — AB (ref 1.7–7.7)
Neutrophils Relative %: 84 % — ABNORMAL HIGH (ref 43–77)
PLATELETS: 273 10*3/uL (ref 150–400)
RBC: 5.79 MIL/uL (ref 4.22–5.81)
RDW: 12.6 % (ref 11.5–15.5)
WBC: 13.9 10*3/uL — AB (ref 4.0–10.5)

## 2014-01-09 LAB — COMPREHENSIVE METABOLIC PANEL
ALT: 26 U/L (ref 0–53)
AST: 16 U/L (ref 0–37)
Albumin: 4.7 g/dL (ref 3.5–5.2)
Alkaline Phosphatase: 86 U/L (ref 39–117)
BILIRUBIN TOTAL: 0.9 mg/dL (ref 0.3–1.2)
BUN: 16 mg/dL (ref 6–23)
CHLORIDE: 88 meq/L — AB (ref 96–112)
CO2: 19 meq/L (ref 19–32)
CREATININE: 1.03 mg/dL (ref 0.50–1.35)
Calcium: 10.1 mg/dL (ref 8.4–10.5)
GFR calc Af Amer: >90 mL/min (ref >90)
GLUCOSE: 1210 mg/dL — AB (ref 70–99)
Potassium: 4.9 mEq/L (ref 3.7–5.3)
Sodium: 132 mEq/L — ABNORMAL LOW (ref 137–147)
Total Protein: 8.1 g/dL (ref 6.0–8.3)

## 2014-01-09 LAB — CBG MONITORING, ED
GLUCOSE-CAPILLARY: 493 mg/dL — AB (ref 70–99)
GLUCOSE-CAPILLARY: 339 mg/dL — AB (ref 70–99)
Glucose-Capillary: 424 mg/dL — ABNORMAL HIGH (ref 70–99)
Glucose-Capillary: >600 mg/dL (ref 70–99)

## 2014-01-09 LAB — PHOSPHORUS: Phosphorus: 2.3 mg/dL (ref 2.3–4.6)

## 2014-01-09 LAB — MRSA PCR SCREENING: MRSA BY PCR: NEGATIVE

## 2014-01-09 LAB — HIV ANTIBODY (ROUTINE TESTING W REFLEX): HIV: NONREACTIVE

## 2014-01-09 MED ORDER — PROMETHAZINE HCL 25 MG/ML IJ SOLN
12.5000 mg | INTRAMUSCULAR | Status: DC | PRN
  Filled 2014-01-09: qty 1

## 2014-01-09 MED ORDER — SODIUM CHLORIDE 0.45 % IV SOLN
INTRAVENOUS | Status: DC
  Administered 2014-01-09: 04:00:00 via INTRAVENOUS

## 2014-01-09 MED ORDER — INSULIN GLARGINE 100 UNIT/ML ~~LOC~~ SOLN
10.0000 [IU] | Freq: Every day | SUBCUTANEOUS | Status: DC
  Administered 2014-01-09: 10 [IU] via SUBCUTANEOUS
  Filled 2014-01-09 (×2): qty 0.1

## 2014-01-09 MED ORDER — ONDANSETRON HCL 4 MG/2ML IJ SOLN: 4.0000 mg | Freq: Three times a day (TID) | INTRAMUSCULAR | Status: DC | PRN

## 2014-01-09 MED ORDER — POTASSIUM CHLORIDE 10 MEQ/100ML IV SOLN
10.0000 meq | INTRAVENOUS | Status: AC
  Administered 2014-01-09: 10 meq via INTRAVENOUS
  Filled 2014-01-09: qty 100

## 2014-01-09 MED ORDER — DEXTROSE 5 % IV SOLN
INTRAVENOUS | Status: DC
  Administered 2014-01-09: 100 mL via INTRAVENOUS
  Administered 2014-01-10 – 2014-01-11 (×2): via INTRAVENOUS

## 2014-01-09 MED ORDER — SODIUM CHLORIDE 0.9 % IV BOLUS (SEPSIS)
1000.0000 mL | Freq: Once | INTRAVENOUS | Status: AC
  Administered 2014-01-09: 1000 mL via INTRAVENOUS

## 2014-01-09 NOTE — Progress Notes (Addendum)
Inpatient Diabetes Program Recommendations  AACE/ADA: New Consensus Statement on Inpatient Glycemic Control (2013)  Target Ranges:  Prepandial:   less than 140 mg/dL      Peak postprandial:   less than 180 mg/dL (1-2 hours)      Critically ill patients:  140 - 180 mg/dL   Reason for Visit: Patient admitted with CBG greater than 1200.  He states that he was on insulin in the past but the insulin was stopped while he was in jail.  He states that his meter was reading that his CBG's were low prior to admit.  A1C not available.  He states that he knows that he needs insulin and plans to follow-up at [facility].  He currently does not have insurance.  Recommended Reli-on glucose meter at [facility] to replace meter that has been giving false readings.   He will need affordable insulin regimen.  Note that in the past he was taking Novolin 70/30 45 units bid (at last visit to clinic in 2012).    Consider 70/30 insulin-Novolin-Reli on at discharge (24.88$ [facility] Currently patient on IV insulin, CBG's remain greater than 200 mg/dL (likely because patient ate CHO modified lunch).  Discussed with MD.  Recommend covering CHO with Glucostabilizer while patient is on insulin drip.   Will follow.  Beryl MeagerJenny Saraiyah Hemminger, RN, BC-ADM Inpatient Diabetes Coordinator Pager [phone]

## 2014-01-09 NOTE — ED Provider Notes (Signed)
Medical screening examination/treatment/procedure(s) were conducted as a shared visit with non-physician practitioner(s) and myself.  I personally evaluated the patient during the encounter.   EKG Interpretation None       CRITICAL CARE Performed by: Lyanne CoAMPOS,Dorea Duff M Total critical care time: 30 Critical care time was exclusive of separately billable procedures and treating other patients. Critical care was necessary to treat or prevent imminent or life-threatening deterioration. Critical care was time spent personally by me on the following activities: development of treatment plan with patient and/or surrogate as well as nursing, discussions with consultants, evaluation of patient's response to treatment, examination of patient, obtaining history from patient or surrogate, ordering and performing treatments and interventions, ordering and review of laboratory studies, ordering and review of radiographic studies, pulse oximetry and re-evaluation of patient's condition.  Admit for hyperglycemia/DKA. Insulin gtt. Bolus fluid.   Results for orders placed during the hospital encounter of 01/08/14  URINALYSIS, ROUTINE W REFLEX MICROSCOPIC      Result Value Ref Range   Color, Urine STRAW (*) YELLOW   APPearance CLEAR  CLEAR   Specific Gravity, Urine <1.005 (*) 1.005 - 1.030   pH 5.5  5.0 - 8.0   Glucose, UA >1000 (*) NEGATIVE mg/dL   Hgb urine dipstick NEGATIVE  NEGATIVE   Bilirubin Urine NEGATIVE  NEGATIVE   Ketones, ur 15 (*) NEGATIVE mg/dL   Protein, ur NEGATIVE  NEGATIVE mg/dL   Urobilinogen, UA 0.2  0.0 - 1.0 mg/dL   Nitrite NEGATIVE  NEGATIVE   Leukocytes, UA NEGATIVE  NEGATIVE  COMPREHENSIVE METABOLIC PANEL      Result Value Ref Range   Sodium 132 (*) 137 - 147 mEq/L   Potassium 4.9  3.7 - 5.3 mEq/L   Chloride 88 (*) 96 - 112 mEq/L   CO2 19  19 - 32 mEq/L   Glucose, Bld 1210 (*) 70 - 99 mg/dL   BUN 16  6 - 23 mg/dL   Creatinine, Ser 1.611.03  0.50 - 1.35 mg/dL   Calcium 09.610.1  8.4  - 10.5 mg/dL   Total Protein 8.1  6.0 - 8.3 g/dL   Albumin 4.7  3.5 - 5.2 g/dL   AST 16  0 - 37 U/L   ALT 26  0 - 53 U/L   Alkaline Phosphatase 86  39 - 117 U/L   Total Bilirubin 0.9  0.3 - 1.2 mg/dL   GFR calc non Af Amer >90  >90 mL/min   GFR calc Af Amer >90  >90 mL/min  URINE MICROSCOPIC-ADD ON      Result Value Ref Range   WBC, UA 0-2  <3 WBC/hpf   RBC / HPF 0-2  <3 RBC/hpf  CBC WITH DIFFERENTIAL      Result Value Ref Range   WBC 13.9 (*) 4.0 - 10.5 K/uL   RBC 5.79  4.22 - 5.81 MIL/uL   Hemoglobin 16.7  13.0 - 17.0 g/dL   HCT 04.545.5  40.939.0 - 81.152.0 %   MCV 78.6  78.0 - 100.0 fL   MCH 28.8  26.0 - 34.0 pg   MCHC 36.7 (*) 30.0 - 36.0 g/dL   RDW 91.412.6  78.211.5 - 95.615.5 %   Platelets 273  150 - 400 K/uL   Neutrophils Relative % 84 (*) 43 - 77 %   Neutro Abs 11.6 (*) 1.7 - 7.7 K/uL   Lymphocytes Relative 11 (*) 12 - 46 %   Lymphs Abs 1.6  0.7 - 4.0 K/uL   Monocytes Relative  5  3 - 12 %   Monocytes Absolute 0.7  0.1 - 1.0 K/uL   Eosinophils Relative 0  0 - 5 %   Eosinophils Absolute 0.0  0.0 - 0.7 K/uL   Basophils Relative 0  0 - 1 %   Basophils Absolute 0.0  0.0 - 0.1 K/uL  CBG MONITORING, ED      Result Value Ref Range   Glucose-Capillary >600 (*) 70 - 99 mg/dL  CBG MONITORING, ED      Result Value Ref Range   Glucose-Capillary >600 (*) 70 - 99 mg/dL  I-STAT VENOUS BLOOD GAS, ED      Result Value Ref Range   pH, Ven 7.359 (*) 7.250 - 7.300   pCO2, Ven 43.2 (*) 45.0 - 50.0 mmHg   pO2, Ven 43.0  30.0 - 45.0 mmHg   Bicarbonate 24.4 (*) 20.0 - 24.0 mEq/L   TCO2 26  0 - 100 mmol/L   O2 Saturation 76.0     Acid-base deficit 1.0  0.0 - 2.0 mmol/L   Collection site BRACHIAL ARTERY     Sample type VENOUS    CBG MONITORING, ED      Result Value Ref Range   Glucose-Capillary >600 (*) 70 - 99 mg/dL      Lyanne Co, MD 01/09/14 0155

## 2014-01-09 NOTE — H&P (Signed)
Date: 01/09/2014               Patient Name:  Brad Murphy MRN: [id number]  DOB: 1986/07/05 Age / Sex: 28 y.o., male   PCP: No Pcp Per Patient         Medical Service: Internal Medicine Teaching Service         Attending Physician: Dr. Dominic Pea, DO    First Contact: Dr. Juluis Mire Pager: [phone]  Second Contact: Dr. Adele Barthel Pager: [phone]       After Hours (After 5p/  First Contact Pager: [phone]  weekends / holidays): Second Contact Pager: [phone]   Chief Complaint:  Increased thirst, nausea and fatigue  History of Present Illness: Mr. Bartoszek is a 28 year old male with a PMH of DM type 2 (Hgb A1c 6.04 Sep 2011) who presents with complaint of "feeling sick" for the past week.  He feels dehydrated and reports polydipsia, polyuria and blurry vision.  He has had decreased po, felt nauseous but has not vomited.  He denies abdominal pain, chest pain, problems breathing or recent URI symptoms.  In the past few days, he has been drinking lots of sugar sweetened beverages including Gatorade and eating hard candies.  He also notes increase in his weight in the past few months of about fifty pounds.  He says he is compliant with $RemoveBefore'1000mg'vmjbJnudKftIS$  Metformin BID, even in the past few days when he has not been feeling well.  He reports his CBGs at home have been normal, 80-100s though he thinks his machine is malfunctioning.  Today his meter read 13, so he drank some juice and it read 28.  When he arrived to the ED CBG was > 600.  In the ED:  T 98.57F, RR 14, SpO2 95%, HR 120, BP 170/33mmHg; he received 4 boluses of NSS and was placed on insulin gtt.    Meds: Current Facility-Administered Medications  Medication Dose Route Frequency Provider Last Rate Last Dose  . 0.9 %  sodium chloride infusion  1,000 mL Intravenous Continuous Hoy Morn, MD      . dextrose 5 %-0.45 % sodium chloride infusion   Intravenous Continuous Hoy Morn, MD      . insulin regular (NOVOLIN R,HUMULIN R) 1  Units/mL in sodium chloride 0.9 % 100 mL infusion   Intravenous Continuous Hoy Morn, MD 10.8 mL/hr at 01/09/14 0134 10.8 Units/hr at 01/09/14 0134  . ondansetron (ZOFRAN) injection 4 mg  4 mg Intravenous Q8H PRN Larene Pickett, PA-C       Current Outpatient Prescriptions  Medication Sig Dispense Refill  . metFORMIN (GLUCOPHAGE) 1000 MG tablet Take 1,000 mg by mouth 2 (two) times daily with a meal.        . Blood Glucose Monitoring Suppl (WAVESENSE PRESTO) W/DEVICE KIT 1 each by Does not apply route QID.  1 each  0  . Lancets 30G MISC Use to check blood sugar 4- times/day. Code-250:00.  100 each  11    Allergies: Allergies as of 01/08/2014 - Review Complete 01/08/2014  Allergen Reaction Noted  . Bee venom Swelling 06/17/2011   Past Medical History  Diagnosis Date  . Diabetes mellitus July 2012    diagnosed after DKA in July 2012  . HTN (hypertension)    History reviewed. No pertinent past surgical history. Family History  Problem Relation Age of Onset  . Hypertension Mother    History   Social History  . Marital Status: Single  Spouse Name: N/A    Number of Children: N/A  . Years of Education: N/A   Occupational History  . student    Social History Main Topics  . Smoking status: Current Some Day Smoker -- 0.30 packs/day    Types: Cigarettes    Last Attempt to Quit: 05/01/2011  . Smokeless tobacco: Not on file  . Alcohol Use: No  . Drug Use: No  . Sexual Activity: Yes   Other Topics Concern  . 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 General:  Increased fatigue Cardiopulmonary:  Denies chest pain or dyspnea GI:  See HPI GU:  Pain with urination but denies burning; + frequency Neuro:  B/L leg weakness in the past few days since he has felt unwell  Physical Exam: Blood pressure 122/70, pulse 105,  temperature 98.9 F (37.2 C), temperature source Oral, resp. rate 16, SpO2 97.00%. General: resting in bed in NAD HEENT: PERRL, EOMI, oropharynx clear Cardiac: RRR, no rubs, murmurs or gallops Pulm: clear to auscultation bilaterally, moving normal volumes of air Abd: obese, soft, nontender, nondistended, BS present, no CVAT Ext: warm and well perfused, no pedal edema Neuro: alert and oriented X3, cranial nerves II-XII grossly intact, 5/5 MMS, sensation intact B/L  Lab results: Basic Metabolic Panel:  Recent Labs  99/57/90 2106  NA 132*  K 4.9  CL 88*  CO2 19  GLUCOSE 1210*  BUN 16  CREATININE 1.03  CALCIUM 10.1   Liver Function Tests:  Recent Labs  01/08/14 2106  AST 16  ALT 26  ALKPHOS 86  BILITOT 0.9  PROT 8.1  ALBUMIN 4.7   CBC:  Recent Labs  01/09/14 0125  WBC 13.9*  NEUTROABS 11.6*  HGB 16.7  HCT 45.5  MCV 78.6  PLT 273   CBG:  Recent Labs  01/08/14 2055 01/09/14 0007 01/09/14 0130  GLUCAP >600* >600* >600*   Urinalysis:  Recent Labs  01/08/14 2104  COLORURINE STRAW*  LABSPEC <1.005*  PHURINE 5.5  GLUCOSEU >1000*  HGBUR NEGATIVE  BILIRUBINUR NEGATIVE  KETONESUR 15*  PROTEINUR NEGATIVE  UROBILINOGEN 0.2  NITRITE NEGATIVE  LEUKOCYTESUR NEGATIVE   Other results: EKG:  NSR, 96 bpm, prolonged QT  Assessment & Plan by Problem: 28 year old male with a PMH of DM type 2 (Hgb A1c 6.04 Sep 2011) who presents with DKA.  DKA:    No signs or symptoms of ACS or infection.  Dysuria but negative UA.  DKA likely due to increased insulin requirement in the setting of recent weight gain.  Patient was previously on insulin but he says it was stopped 2/2 to episodes of hypoglycemia.  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  He was discharged on 120 units Lantus BID and Novolog 15 units TID.  His insulin was  d/c per the patient and he has been on metformin for some time now.  He reports compliance with metformin and says his BG was well controlled during the time he was in jail (90 days) at which time he was working, eating healthier and had lost some weight.  He was released 9 months ago and feels he has gained close to 50 pounds  in that time.  He has had recent dietary indiscretions, including sugar sweetened beverages and candy.  He denies EtOH use.  He smokes marijuana occasionally, but denies cocaine or other drug use. - admit to IMTS on telemetry - NPO - IV fluids; NSS --> 0.5NS given increase in Na to 150 after NSS - continue insulin gtt until DKA resolved - BMP q2h - Brutus Lantus once two consecutive BMPs return with closed AG  DM type 2:  Patient initially on high doses of insulin.  Hgb A1c 6.4 in Nov 2012 and basal insulin was reduced to 45 units BID at that time.  He says it was subsequently d/c altogether and he has just been taking Metformin. - treating DKA as above - will likely need to continue on an insulin regimen at discharge - will need to re-establish care with a PCP; he used to follow with [facility], however has not been to clinic in 2 years; he would like to return to [facility] for care.  Hypertension:  Stable.  He was previously on Lisinopril but no longer taking this medication.  Diet:  NPO  VTE ppx:  [place] heparin  Code status:  Full  Dispo: Disposition is deferred at this time, awaiting improvement of current medical problems. Anticipated discharge in approximately 1-2 day(s).   The patient does not have a current PCP (No Pcp Per Patient) and does need an [facility] hospital follow-up appointment after discharge.  The patient does not know have transportation limitations that hinder transportation to clinic appointments.  Signed: Duwaine Maxin, DO 01/09/2014, 1:45 AM

## 2014-01-09 NOTE — ED Notes (Signed)
Lab reported to RN critical potassium of 2.2, Chloride of >130, CO2 of 7, Mag of 0.6 and Calcium of 2.2. RN told lab to credit patient and a redraw would be done. MD notified. Per MD, redraw sample after fluids have been paused for 30 minutes.

## 2014-01-09 NOTE — H&P (Signed)
INTERNAL MEDICINE TEACHING SERVICE Attending Admission Note  Date: 01/09/2014  Patient name: Brad Murphy  Medical record number: [id number]  Date of birth: 1986-07-27    I have seen and evaluated Brad Murphy and discussed their care with the Residency Team.  28 yr old male with pmhx significant for Type 2 DM who presented with fatigue, nausea, and increased thirst. He states he has had decreased oral intake, weakness, polydipsia, and polyuria. He states he is only taking metformin and not insulin. He checked his BG and it is noted that it was giving him falsely low readings. The housestaff have aggressively treated him with IVF resuscitation and insulin infusion. I agree that he has evidence of DKA. His electrolyte corrections have been difficult due to uncertain lab values. This morning, he feels better. He states he feels thirsty. On exam, he appears dry. Given his hyperNa and hyperCl, I agree that overly aggressive volume resuscitation with NS could have caused this. At this time, given his improving AG, I would encourage PO intake and continue a hypotonic solution. Overlap SQ insulin as regular insulin gtt is stopped. He will need education on insulin use and outpatient follow up. I suspect he will be ready for D/C tomorrow.  Jonah BlueAlejandro Nahshon Reich, DO, FACP Faculty [facility] Residency Program 01/09/2014, 3:07 PM

## 2014-01-10 DIAGNOSIS — I10 Essential (primary) hypertension: Secondary | ICD-10-CM

## 2014-01-10 DIAGNOSIS — E1310 Other specified diabetes mellitus with ketoacidosis without coma: Principal | ICD-10-CM

## 2014-01-10 DIAGNOSIS — E870 Hyperosmolality and hypernatremia: Secondary | ICD-10-CM

## 2014-01-10 LAB — BASIC METABOLIC PANEL
BUN: 10 mg/dL (ref 6–23)
BUN: 11 mg/dL (ref 6–23)
BUN: 11 mg/dL (ref 6–23)
BUN: 7 mg/dL (ref 6–23)
BUN: 7 mg/dL (ref 6–23)
BUN: 7 mg/dL (ref 6–23)
BUN: 9 mg/dL (ref 6–23)
BUN: 9 mg/dL (ref 6–23)
CALCIUM: 9.2 mg/dL (ref 8.4–10.5)
CALCIUM: 9.3 mg/dL (ref 8.4–10.5)
CHLORIDE: 108 meq/L (ref 96–112)
CHLORIDE: 108 meq/L (ref 96–112)
CO2: 22 mEq/L (ref 19–32)
CO2: 23 mEq/L (ref 19–32)
CO2: 23 meq/L (ref 19–32)
CO2: 24 mEq/L (ref 19–32)
CO2: 24 mEq/L (ref 19–32)
CO2: 24 meq/L (ref 19–32)
CO2: 24 meq/L (ref 19–32)
CO2: 25 meq/L (ref 19–32)
CREATININE: 1.05 mg/dL (ref 0.50–1.35)
CREATININE: 1.05 mg/dL (ref 0.50–1.35)
CREATININE: 1.06 mg/dL (ref 0.50–1.35)
Calcium: 9.7 mg/dL (ref 8.4–10.5)
Calcium: 9.7 mg/dL (ref 8.4–10.5)
Calcium: 9.7 mg/dL (ref 8.4–10.5)
Calcium: 9.5 mg/dL (ref 8.4–10.5)
Calcium: 9.3 mg/dL (ref 8.4–10.5)
Calcium: 9.2 mg/dL (ref 8.4–10.5)
Chloride: 109 mEq/L (ref 96–112)
Chloride: 109 mEq/L (ref 96–112)
Chloride: 108 mEq/L (ref 96–112)
Chloride: 108 mEq/L (ref 96–112)
Chloride: 108 mEq/L (ref 96–112)
Chloride: 105 mEq/L (ref 96–112)
Creatinine, Ser: 1.09 mg/dL (ref 0.50–1.35)
Creatinine, Ser: 1.15 mg/dL (ref 0.50–1.35)
Creatinine, Ser: 1.11 mg/dL (ref 0.50–1.35)
Creatinine, Ser: 1.09 mg/dL (ref 0.50–1.35)
Creatinine, Ser: 0.93 mg/dL (ref 0.50–1.35)
GFR calc Af Amer: >90 mL/min (ref >90)
GFR calc Af Amer: >90 mL/min (ref >90)
GFR calc Af Amer: >90 mL/min (ref >90)
GFR calc Af Amer: >90 mL/min (ref >90)
GFR calc Af Amer: >90 mL/min (ref >90)
GFR calc Af Amer: >90 mL/min (ref >90)
GFR calc Af Amer: >90 mL/min (ref >90)
GFR calc non Af Amer: 87 mL/min — ABNORMAL LOW (ref >90)
GFR calc non Af Amer: >90 mL/min (ref >90)
GFR calc non Af Amer: >90 mL/min (ref >90)
GFR calc non Af Amer: >90 mL/min (ref >90)
GFR calc non Af Amer: >90 mL/min (ref >90)
GFR calc non Af Amer: >90 mL/min (ref >90)
GLUCOSE: 161 mg/dL — AB (ref 70–99)
GLUCOSE: 182 mg/dL — AB (ref 70–99)
GLUCOSE: 175 mg/dL — AB (ref 70–99)
GLUCOSE: 171 mg/dL — AB (ref 70–99)
GLUCOSE: 186 mg/dL — AB (ref 70–99)
Glucose, Bld: 168 mg/dL — ABNORMAL HIGH (ref 70–99)
Glucose, Bld: 190 mg/dL — ABNORMAL HIGH (ref 70–99)
Glucose, Bld: 184 mg/dL — ABNORMAL HIGH (ref 70–99)
POTASSIUM: 3.6 meq/L — AB (ref 3.7–5.3)
Potassium: 3.4 mEq/L — ABNORMAL LOW (ref 3.7–5.3)
Potassium: 3.5 mEq/L — ABNORMAL LOW (ref 3.7–5.3)
Potassium: 3.7 mEq/L (ref 3.7–5.3)
Potassium: 3.5 mEq/L — ABNORMAL LOW (ref 3.7–5.3)
Potassium: 4 mEq/L (ref 3.7–5.3)
Potassium: 3.7 mEq/L (ref 3.7–5.3)
Potassium: 3.2 mEq/L — ABNORMAL LOW (ref 3.7–5.3)
SODIUM: 151 meq/L — AB (ref 137–147)
Sodium: 149 mEq/L — ABNORMAL HIGH (ref 137–147)
Sodium: 148 mEq/L — ABNORMAL HIGH (ref 137–147)
Sodium: 148 mEq/L — ABNORMAL HIGH (ref 137–147)
Sodium: 147 mEq/L (ref 137–147)
Sodium: 147 mEq/L (ref 137–147)
Sodium: 146 mEq/L (ref 137–147)
Sodium: 143 mEq/L (ref 137–147)

## 2014-01-10 LAB — GLUCOSE, CAPILLARY
GLUCOSE-CAPILLARY: 138 mg/dL — AB (ref 70–99)
GLUCOSE-CAPILLARY: 191 mg/dL — AB (ref 70–99)
GLUCOSE-CAPILLARY: 215 mg/dL — AB (ref 70–99)
GLUCOSE-CAPILLARY: 251 mg/dL — AB (ref 70–99)
GLUCOSE-CAPILLARY: 156 mg/dL — AB (ref 70–99)
GLUCOSE-CAPILLARY: 156 mg/dL — AB (ref 70–99)
GLUCOSE-CAPILLARY: 134 mg/dL — AB (ref 70–99)
GLUCOSE-CAPILLARY: 177 mg/dL — AB (ref 70–99)
Glucose-Capillary: 145 mg/dL — ABNORMAL HIGH (ref 70–99)
Glucose-Capillary: 122 mg/dL — ABNORMAL HIGH (ref 70–99)
Glucose-Capillary: 127 mg/dL — ABNORMAL HIGH (ref 70–99)
Glucose-Capillary: 139 mg/dL — ABNORMAL HIGH (ref 70–99)
Glucose-Capillary: 141 mg/dL — ABNORMAL HIGH (ref 70–99)
Glucose-Capillary: 148 mg/dL — ABNORMAL HIGH (ref 70–99)
Glucose-Capillary: 153 mg/dL — ABNORMAL HIGH (ref 70–99)
Glucose-Capillary: 161 mg/dL — ABNORMAL HIGH (ref 70–99)
Glucose-Capillary: 161 mg/dL — ABNORMAL HIGH (ref 70–99)
Glucose-Capillary: 168 mg/dL — ABNORMAL HIGH (ref 70–99)
Glucose-Capillary: 202 mg/dL — ABNORMAL HIGH (ref 70–99)
Glucose-Capillary: 203 mg/dL — ABNORMAL HIGH (ref 70–99)
Glucose-Capillary: 207 mg/dL — ABNORMAL HIGH (ref 70–99)
Glucose-Capillary: 213 mg/dL — ABNORMAL HIGH (ref 70–99)
Glucose-Capillary: 217 mg/dL — ABNORMAL HIGH (ref 70–99)
Glucose-Capillary: 220 mg/dL — ABNORMAL HIGH (ref 70–99)
Glucose-Capillary: 164 mg/dL — ABNORMAL HIGH (ref 70–99)
Glucose-Capillary: 177 mg/dL — ABNORMAL HIGH (ref 70–99)

## 2014-01-10 LAB — MAGNESIUM: MAGNESIUM: 2.1 mg/dL (ref 1.5–2.5)

## 2014-01-10 LAB — PHOSPHORUS: Phosphorus: 3.1 mg/dL (ref 2.3–4.6)

## 2014-01-10 MED ORDER — POTASSIUM CHLORIDE CRYS ER 20 MEQ PO TBCR
40.0000 meq | EXTENDED_RELEASE_TABLET | Freq: Once | ORAL | Status: AC
  Administered 2014-01-10: 40 meq via ORAL
  Filled 2014-01-10: qty 2

## 2014-01-10 MED ORDER — SODIUM CHLORIDE 0.9 % IV BOLUS (SEPSIS): 500.0000 mL | Freq: Once | INTRAVENOUS | Status: DC

## 2014-01-10 MED ORDER — DEXTROSE-NACL 5-0.45 % IV SOLN
INTRAVENOUS | Status: DC
  Administered 2014-01-10: 09:00:00 via INTRAVENOUS

## 2014-01-10 MED ORDER — HYDRALAZINE HCL 20 MG/ML IJ SOLN: 2.0000 mg | Freq: Three times a day (TID) | INTRAMUSCULAR | Status: DC | PRN

## 2014-01-10 MED ORDER — DEXTROSE 50 % IV SOLN: 25.0000 mL | INTRAVENOUS | Status: DC | PRN

## 2014-01-10 MED ORDER — INFLUENZA VAC SPLIT QUAD 0.5 ML IM SUSP
0.5000 mL | INTRAMUSCULAR | Status: AC
  Administered 2014-01-12: 0.5 mL via INTRAMUSCULAR
  Filled 2014-01-10: qty 0.5

## 2014-01-10 MED ORDER — HEPARIN SODIUM (PORCINE) 5000 UNIT/ML IJ SOLN
5000.0000 [IU] | Freq: Three times a day (TID) | INTRAMUSCULAR | Status: DC
  Administered 2014-01-10 – 2014-01-12 (×6): 5000 [IU] via SUBCUTANEOUS
  Filled 2014-01-10 (×14): qty 1

## 2014-01-10 NOTE — Progress Notes (Signed)
  Date: 01/10/2014  Patient name: Brad Murphy  Medical record number: [id number]  Date of birth: 27-May-1986   This patient has been seen and the plan of care was discussed with the house staff. Please see their note for complete details. I concur with their findings with the following additions/corrections: Pt has noted AG that was opening once again. I agree with continued insulin gtt until AG closes. I suspect he needs more volume resuscitation as he appears clinically dry. His Na is improving.  I agree with the diabetes coordinator recommendation regarding SQ insulin, we must not underdose or he will go back into DKA.  Brad BlueAlejandro Kennon Encinas, DO, FACP Faculty [facility] 01/10/2014, 2:08 PM

## 2014-01-10 NOTE — Progress Notes (Signed)
Briefly saw patient.  He continues on insulin drip.  Insulin drip rates continue to be high.  At transition will likely need at least 40 units of 70/30 bid with meals.   Discussed 70/30 regimen with patient and purchasing new meter from [employer].    Thanks, Beryl MeagerJenny Janaiya Beauchesne, RN, BC-ADM Inpatient Diabetes Coordinator Pager [phone]

## 2014-01-10 NOTE — Progress Notes (Signed)
Patient transferred to unit. Oriented to room and equipment. No distress noted. Will continue to monitor.

## 2014-01-10 NOTE — Progress Notes (Addendum)
Subjective:  Pt seen and examined in AM. No acute events overnight. Pt continues to be very thirsty and hungry but denies polyuria, weakness, blurry vision, nausea, vomiting, abdominal pain or change in BM. He is able to ambulate without difficulty.    Objective: Vital signs in last 24 hours: Filed Vitals:   01/10/14 0700 01/10/14 0800 01/10/14 0843 01/10/14 1250  BP: 142/66 153/82  130/63  Pulse: 78 79  66  Temp:   98 F (36.7 C) 97.4 F (36.3 C)  TempSrc:   Oral Oral  Resp: 7 26  14   SpO2: 94% 95%  98%   Weight change:   Intake/Output Summary (Last 24 hours) at 01/10/14 1344 Last data filed at 01/10/14 1252  Gross per 24 hour  Intake 2565.62 ml  Output    695 ml  Net 1870.62 ml   General: NAD  Cardiac: Regular rate and rhythm   Pulm: clear to auscultation bilaterally  Abd: obese, soft, nontender, BS present  Ext: no edema  Neuro: alert and oriented X3   Lab Results: Basic Metabolic Panel:  Recent Labs Lab 01/09/14 1000  01/10/14 0100  01/10/14 0904 01/10/14 1136  NA 153*  < > 151*  < > 148* 147  K 4.1  < > 3.4*  < > 3.7 3.5*  CL 113*  < > 109  < > 108 108  CO2 25  < > 24  < > 23 24  GLUCOSE 258*  < > 161*  < > 175* 190*  BUN 13  < > 11  < > 9 9  CREATININE 1.12  < > 1.15  < > 1.09 1.05  CALCIUM 10.0  < > 9.7  < > 9.5 9.2  MG  --   --  2.1  --   --   --   PHOS 2.3  --  3.1  --   --   --   < > = values in this interval not displayed. Liver Function Tests:  Recent Labs Lab 01/08/14 2106  AST 16  ALT 26  ALKPHOS 86  BILITOT 0.9  PROT 8.1  ALBUMIN 4.7   CBC:  Recent Labs Lab 01/09/14 0125  WBC 13.9*  NEUTROABS 11.6*  HGB 16.7  HCT 45.5  MCV 78.6  PLT 273   CBG:  Recent Labs Lab 01/10/14 0358 01/10/14 0502 01/10/14 0611 01/10/14 0710 01/10/14 0814 01/10/14 0938  GLUCAP 156* 168* 156* 134* 145* 153*    Urinalysis:  Recent Labs Lab 01/08/14 2104  COLORURINE STRAW*  LABSPEC <1.005*  PHURINE 5.5  GLUCOSEU >1000*  HGBUR  NEGATIVE  BILIRUBINUR NEGATIVE  KETONESUR 15*  PROTEINUR NEGATIVE  UROBILINOGEN 0.2  NITRITE NEGATIVE  LEUKOCYTESUR NEGATIVE     Micro Results: Recent Results (from the past 240 hour(s))  MRSA PCR SCREENING     Status: None   Collection Time    01/09/14  6:17 AM      Result Value Ref Range Status   MRSA by PCR NEGATIVE  NEGATIVE Final   Comment:            The GeneXpert MRSA Assay (FDA     approved for NASAL specimens     only), is one component of a     comprehensive MRSA colonization     surveillance program. It is not     intended to diagnose MRSA     infection nor to guide or     monitor treatment for  MRSA infections.   Studies/Results: No results found. Medications: I have reviewed the patient's current medications. Scheduled Meds: . heparin  5,000 Units Subcutaneous 3 times per day   Continuous Infusions: . dextrose 125 mL/hr at 01/10/14 1114  . insulin (NOVOLIN-R) infusion 11.6 Units/hr (01/10/14 1252)   PRN Meds:.dextrose, hydrALAZINE, promethazine Assessment/Plan:  Diabetic Ketoacidosis with hypernatremia in setting of Type II Diabetes Mellitus - AG still open (14). Pt with glucose of 1210, ketonuria, and AG 25 on admission.  Percipitent most likely due to dehydration. Pt appears volume depleted. Last A1c of 6.4 on 09/12/11, previously on 20 units Lantus BID and Novolog 15 units TID now on metformin -Currently NPO, advance diet   -D5W 125 mL/hr for hyperanatremia until Na stabilizes to normal  -D50 PRN CBG <70  -Continue insulin drip until AG normal (12) for 2 BMETs,  then overlap with SQ Lantus (2 hrs)   -If glucose <250 and AG open, add D5W 1/2 NS -Replace potassium with 20-40 mEq/L with fluids or PO,  if K <4.5  (no renal failure) -Replace phosphorus if <1 -Obtain BMET 3 hrs, CBG's Q 1 hr -Promethazine 12.5 mg Q 4hr PRN nausea -Monitor on telemetry -Appreciate Diabetes Coordinator recommendations --> will need 70/30 Insulin at least 40 U BID    Hypertension -currently hypertensive. Pt previously on lisinopril however no longer taking.  -Continue to monitor -Consider adding   Diet: NPO except ice chips and water DVT PPx: SQ heparin TID Code: Full   Dispo: Disposition is deferred at this time, awaiting improvement of current medical problems.    The patient does have a current PCP (No Pcp Per Patient) and does need [facility] after discharge.  The patient does not have transportation limitations that hinder transportation to clinic appointments.  .Services Needed at time of discharge: Y = Yes, Blank = No PT:   OT:   RN:   Equipment:   Other:     LOS: 2 days   Otis BraceMarjan Elion Hocker, MD 01/10/2014, 1:44 PM

## 2014-01-10 NOTE — Progress Notes (Signed)
Night Float Interim Progress Note:  I went to evaluate Brad Murphy this evening.  He is lying in bed but thirsty. His blood sugars have improved to 140s and Na has come down slightly to 152, corrected 153 and AG 17.    Vitals reviewed. General: resting in bed, NAD, thirsty HEENT: EOMI Cardiac: RRR Pulm: clear to auscultation bilaterally Abd: obese, soft, nontender, BS present Ext: warm and well perfused, moving all 4 extremities Neuro: alert and oriented X3, cranial nerves II-XII grossly intact, strength and sensation to light touch equal in bilateral upper and lower extremities  Brad Murphy is a 28 year old morbidly obese African American male with DM admitted for DKA and developed hypernatremia. Currently on insulin gtt with improving AG and blood sugars.   -continue D5W for now -continue insulin gtt, bmet pending now, mag, phos -K 3.6 at last bmet @2110 , will give kdur po 40meq now, patient does not want runs of k -continue NPO -will continue to monitor closely, encourage timely bmet draw's.

## 2014-01-11 DIAGNOSIS — E876 Hypokalemia: Secondary | ICD-10-CM

## 2014-01-11 LAB — BASIC METABOLIC PANEL
BUN: 6 mg/dL (ref 6–23)
BUN: 6 mg/dL (ref 6–23)
BUN: 6 mg/dL (ref 6–23)
BUN: 6 mg/dL (ref 6–23)
CALCIUM: 9.3 mg/dL (ref 8.4–10.5)
CO2: 26 meq/L (ref 19–32)
CO2: 23 mEq/L (ref 19–32)
CO2: 24 mEq/L (ref 19–32)
CO2: 24 mEq/L (ref 19–32)
CREATININE: 0.99 mg/dL (ref 0.50–1.35)
Calcium: 9.1 mg/dL (ref 8.4–10.5)
Calcium: 9 mg/dL (ref 8.4–10.5)
Calcium: 8.8 mg/dL (ref 8.4–10.5)
Chloride: 106 mEq/L (ref 96–112)
Chloride: 105 mEq/L (ref 96–112)
Chloride: 105 mEq/L (ref 96–112)
Chloride: 104 mEq/L (ref 96–112)
Creatinine, Ser: 1.07 mg/dL (ref 0.50–1.35)
Creatinine, Ser: 1 mg/dL (ref 0.50–1.35)
Creatinine, Ser: 0.94 mg/dL (ref 0.50–1.35)
GFR calc Af Amer: >90 mL/min (ref >90)
GFR calc Af Amer: >90 mL/min (ref >90)
GFR calc Af Amer: >90 mL/min (ref >90)
GFR calc non Af Amer: >90 mL/min (ref >90)
GFR calc non Af Amer: >90 mL/min (ref >90)
GLUCOSE: 169 mg/dL — AB (ref 70–99)
GLUCOSE: 259 mg/dL — AB (ref 70–99)
Glucose, Bld: 172 mg/dL — ABNORMAL HIGH (ref 70–99)
Glucose, Bld: 168 mg/dL — ABNORMAL HIGH (ref 70–99)
POTASSIUM: 3.5 meq/L — AB (ref 3.7–5.3)
POTASSIUM: 4.2 meq/L (ref 3.7–5.3)
Potassium: 3.3 mEq/L — ABNORMAL LOW (ref 3.7–5.3)
Potassium: 3.6 mEq/L — ABNORMAL LOW (ref 3.7–5.3)
Sodium: 146 mEq/L (ref 137–147)
Sodium: 144 mEq/L (ref 137–147)
Sodium: 143 mEq/L (ref 137–147)
Sodium: 141 mEq/L (ref 137–147)

## 2014-01-11 LAB — GLUCOSE, CAPILLARY
GLUCOSE-CAPILLARY: 127 mg/dL — AB (ref 70–99)
GLUCOSE-CAPILLARY: 136 mg/dL — AB (ref 70–99)
GLUCOSE-CAPILLARY: 145 mg/dL — AB (ref 70–99)
GLUCOSE-CAPILLARY: 151 mg/dL — AB (ref 70–99)
GLUCOSE-CAPILLARY: 152 mg/dL — AB (ref 70–99)
GLUCOSE-CAPILLARY: 152 mg/dL — AB (ref 70–99)
GLUCOSE-CAPILLARY: 164 mg/dL — AB (ref 70–99)
GLUCOSE-CAPILLARY: 168 mg/dL — AB (ref 70–99)
GLUCOSE-CAPILLARY: 170 mg/dL — AB (ref 70–99)
GLUCOSE-CAPILLARY: 170 mg/dL — AB (ref 70–99)
GLUCOSE-CAPILLARY: 235 mg/dL — AB (ref 70–99)
Glucose-Capillary: 182 mg/dL — ABNORMAL HIGH (ref 70–99)
Glucose-Capillary: 176 mg/dL — ABNORMAL HIGH (ref 70–99)
Glucose-Capillary: 157 mg/dL — ABNORMAL HIGH (ref 70–99)
Glucose-Capillary: 163 mg/dL — ABNORMAL HIGH (ref 70–99)
Glucose-Capillary: 161 mg/dL — ABNORMAL HIGH (ref 70–99)
Glucose-Capillary: 161 mg/dL — ABNORMAL HIGH (ref 70–99)
Glucose-Capillary: 158 mg/dL — ABNORMAL HIGH (ref 70–99)
Glucose-Capillary: 151 mg/dL — ABNORMAL HIGH (ref 70–99)
Glucose-Capillary: 157 mg/dL — ABNORMAL HIGH (ref 70–99)
Glucose-Capillary: 165 mg/dL — ABNORMAL HIGH (ref 70–99)
Glucose-Capillary: 141 mg/dL — ABNORMAL HIGH (ref 70–99)
Glucose-Capillary: 154 mg/dL — ABNORMAL HIGH (ref 70–99)
Glucose-Capillary: 158 mg/dL — ABNORMAL HIGH (ref 70–99)
Glucose-Capillary: 192 mg/dL — ABNORMAL HIGH (ref 70–99)
Glucose-Capillary: 226 mg/dL — ABNORMAL HIGH (ref 70–99)
Glucose-Capillary: 281 mg/dL — ABNORMAL HIGH (ref 70–99)

## 2014-01-11 LAB — HEMOGLOBIN A1C
Hgb A1c MFr Bld: 9.2 % — ABNORMAL HIGH (ref <5.7)
MEAN PLASMA GLUCOSE: 217 mg/dL — AB (ref <117)

## 2014-01-11 MED ORDER — SODIUM CHLORIDE 0.9 % IV SOLN
INTRAVENOUS | Status: DC
  Administered 2014-01-11: 15:00:00 via INTRAVENOUS
  Administered 2014-01-12: 125 mL/h via INTRAVENOUS
  Administered 2014-01-12: 06:00:00 via INTRAVENOUS

## 2014-01-11 MED ORDER — INSULIN ASPART 100 UNIT/ML ~~LOC~~ SOLN
0.0000 [IU] | Freq: Three times a day (TID) | SUBCUTANEOUS | Status: DC
  Administered 2014-01-11: 5 [IU] via SUBCUTANEOUS
  Administered 2014-01-12: 11 [IU] via SUBCUTANEOUS
  Administered 2014-01-12 (×2): 8 [IU] via SUBCUTANEOUS
  Administered 2014-01-13 (×2): 5 [IU] via SUBCUTANEOUS

## 2014-01-11 MED ORDER — POTASSIUM CHLORIDE CRYS ER 20 MEQ PO TBCR
40.0000 meq | EXTENDED_RELEASE_TABLET | Freq: Once | ORAL | Status: AC
  Administered 2014-01-11: 40 meq via ORAL
  Filled 2014-01-11: qty 2

## 2014-01-11 MED ORDER — INSULIN GLARGINE 100 UNIT/ML ~~LOC~~ SOLN
25.0000 [IU] | Freq: Once | SUBCUTANEOUS | Status: AC
  Administered 2014-01-11: 25 [IU] via SUBCUTANEOUS
  Filled 2014-01-11: qty 0.25

## 2014-01-11 MED ORDER — INSULIN ASPART 100 UNIT/ML ~~LOC~~ SOLN
0.0000 [IU] | Freq: Every day | SUBCUTANEOUS | Status: DC
  Administered 2014-01-11: 3 [IU] via SUBCUTANEOUS
  Administered 2014-01-12: 2 [IU] via SUBCUTANEOUS

## 2014-01-11 MED ORDER — SODIUM CHLORIDE 0.9 % IV BOLUS (SEPSIS)
500.0000 mL | Freq: Once | INTRAVENOUS | Status: AC
  Administered 2014-01-11: 500 mL via INTRAVENOUS

## 2014-01-11 MED ORDER — SODIUM CHLORIDE 0.9 % IV SOLN
INTRAVENOUS | Status: DC
  Filled 2014-01-11: qty 1

## 2014-01-11 NOTE — Progress Notes (Signed)
Patient spoke with RN about needs following discharge. He states he has no PCP, and that he may need assistance in getting his medication after discharge. Order is in for case management to see patient.

## 2014-01-11 NOTE — Progress Notes (Signed)
Patient refused Heparin for VTE despite education. Spoke with patient about the importance of ambulation. Will continue to monitor.

## 2014-01-11 NOTE — Progress Notes (Addendum)
Subjective: States that he is hungry still feels thirst but not as much, his urine is not as dark. Denies CP, SOB, abdominal pain, N/V/D.   Objective: Vital signs in last 24 hours: Filed Vitals:   01/10/14 2047 01/10/14 2358 01/11/14 0359 01/11/14 1145  BP: 151/93 144/97 134/87 159/91  Pulse: 93 83 79 76  Temp: 98.7 F (37.1 C) 99.1 F (37.3 C) 98.1 F (36.7 C) 98.1 F (36.7 C)  TempSrc: Oral Oral Oral Oral  Resp: 18 18 18 18   Height: 6\' 3"  (1.905 m)     Weight: 397 lb 4.3 oz (180.2 kg)  400 lb 9.2 oz (181.7 kg)   SpO2: 95% 96% 99% 99%   Weight change:   Intake/Output Summary (Last 24 hours) at 01/11/14 1217 Last data filed at 01/11/14 0525  Gross per 24 hour  Intake 1093.75 ml  Output    675 ml  Net 418.75 ml   Vitals reviewed. General: resting in bed, in NAD HEENT: no scleral icterus, dry MM Cardiac: RRR, no rubs, murmurs or gallops Pulm: clear to auscultation bilaterally, no wheezes, rales, or rhonchi Abd: soft, nontender, nondistended, BS present Ext: warm and well perfused, no pedal edema Neuro: alert and oriented X3, moves 4 extremities voluntarily, follows commands appropriately    Lab Results: Basic Metabolic Panel:  Recent Labs Lab 01/09/14 1000  01/10/14 0100  01/11/14 0627 01/11/14 0904  NA 153*  < > 151*  < > 144 143  K 4.1  < > 3.4*  < > 3.6* 3.5*  CL 113*  < > 109  < > 105 105  CO2 25  < > 24  < > 23 24  GLUCOSE 258*  < > 161*  < > 169* 168*  BUN 13  < > 11  < > 6 6  CREATININE 1.12  < > 1.15  < > 1.00 0.99  CALCIUM 10.0  < > 9.7  < > 9.1 9.0  MG  --   --  2.1  --   --   --   PHOS 2.3  --  3.1  --   --   --   < > = values in this interval not displayed. Liver Function Tests:  Recent Labs Lab 01/08/14 2106  AST 16  ALT 26  ALKPHOS 86  BILITOT 0.9  PROT 8.1  ALBUMIN 4.7   CBC:  Recent Labs Lab 01/09/14 0125  WBC 13.9*  NEUTROABS 11.6*  HGB 16.7  HCT 45.5  MCV 78.6  PLT 273   CBG:  Recent Labs Lab 01/11/14 0627  01/11/14 0729 01/11/14 0831 01/11/14 0932 01/11/14 1035 01/11/14 1136  GLUCAP 157* 165* 141* 154* 158* 152*   Urine Drug Screen: Drugs of Abuse     Component Value Date/Time   LABOPIA NONE DETECTED 05/20/2011 0107   COCAINSCRNUR NONE DETECTED 05/20/2011 0107   LABBENZ NONE DETECTED 05/20/2011 0107   AMPHETMU NONE DETECTED 05/20/2011 0107   THCU NONE DETECTED 05/20/2011 0107   LABBARB NONE DETECTED 05/20/2011 0107    Urinalysis:  Recent Labs Lab 01/08/14 2104  COLORURINE STRAW*  LABSPEC <1.005*  PHURINE 5.5  GLUCOSEU >1000*  HGBUR NEGATIVE  BILIRUBINUR NEGATIVE  KETONESUR 15*  PROTEINUR NEGATIVE  UROBILINOGEN 0.2  NITRITE NEGATIVE  LEUKOCYTESUR NEGATIVE    Micro Results: Recent Results (from the past 240 hour(s))  MRSA PCR SCREENING     Status: None   Collection Time    01/09/14  6:17 AM  Result Value Ref Range Status   MRSA by PCR NEGATIVE  NEGATIVE Final   Comment:            The GeneXpert MRSA Assay (FDA     approved for NASAL specimens     only), is one component of a     comprehensive MRSA colonization     surveillance program. It is not     intended to diagnose MRSA     infection nor to guide or     monitor treatment for     MRSA infections.   Studies/Results: No results found. Medications: I have reviewed the patient's current medications. Scheduled Meds: . heparin  5,000 Units Subcutaneous 3 times per day  . influenza vac split quadrivalent PF  0.5 mL Intramuscular Tomorrow-1000  . potassium chloride  40 mEq Oral Once  . sodium chloride  500 mL Intravenous Once   Continuous Infusions: . dextrose 125 mL/hr at 01/11/14 0432  . insulin (NOVOLIN-R) infusion 8.3 Units/hr (01/11/14 1138)   PRN Meds:.dextrose, hydrALAZINE, promethazine Assessment/Plan: 28 yr old man with PMH of DM2, presenting with DKA in the setting of not taking insulin for months.    Diabetic Ketoacidosis with hypernatremia in setting of Type II Diabetes Mellitus - CBGs  improved to 150s, AG at 14, the lowest that is has been but bicarb normal at 24. His AG could be explained by free water deficit and NPO state. On presentation pt with glucose of 1210, ketonuria, and AG 25 on admission. Cause of the DKA most likely insulin noncompliance and dehydration as he appeared volume depleted. Last A1c of 6.4 on 09/12/11, previously on 20 units Lantus BID and Novolog 15 units TID but on metformin alone at home.  -Lantus 25 SQ now -NS 500ml bolus now -Discontinue insulin drip in 2 hours -SSI moderate with CBG check q ac and Hs -Promethazine 12.5 mg Q 4hr PRN nausea  -Monitor on telemetry  -Appreciate Diabetes Coordinator recommendations --> will need 70/30 Insulin at least 40 U BID starting tomorrow -Check HgA1C  Hypokalemia - Likely secondary to the glucose drip. K of 3.5 this morning.  -Replete with K dur 40mEq now  Hypernatremia - Resolved. Sodium of 143 today. Sodium as high as 153, corrected to 158 for glucose of 577. Gradually trended down with D5w infusion.   Hypertension -Currently hypertensive to 159/91 this morning. Pt previously on lisinopril however no longer taking.  -Continue to monitor  -Will add Lisinopril 5mg  today given stable Cr and improvement of his UOP  Diet: Carb mod diet 2 hours after Lantus is given  DVT PPx: SQ heparin TID   Code: Full   Dispo: Disposition is deferred at this time, awaiting improvement of current medical problems.  Anticipated discharge in approximately 1-2 day(s).   The patient doesnot have a current PCP, [facility] and [facility].  The patient does not have transportation limitations that hinder transportation to clinic appointments.  .Services Needed at time of discharge: Y = Yes, Blank = No PT:   OT:   RN:   Equipment:   Other:     LOS: 3 days   Ky BarbanSolianny D Akshaj Besancon, MD 01/11/2014, 12:17 PM

## 2014-01-12 DIAGNOSIS — E785 Hyperlipidemia, unspecified: Secondary | ICD-10-CM

## 2014-01-12 LAB — GLUCOSE, CAPILLARY
GLUCOSE-CAPILLARY: 225 mg/dL — AB (ref 70–99)
Glucose-Capillary: 285 mg/dL — ABNORMAL HIGH (ref 70–99)
Glucose-Capillary: 262 mg/dL — ABNORMAL HIGH (ref 70–99)
Glucose-Capillary: 325 mg/dL — ABNORMAL HIGH (ref 70–99)
Glucose-Capillary: 215 mg/dL — ABNORMAL HIGH (ref 70–99)

## 2014-01-12 LAB — BASIC METABOLIC PANEL
BUN: 8 mg/dL (ref 6–23)
CHLORIDE: 106 meq/L (ref 96–112)
CO2: 21 mEq/L (ref 19–32)
CREATININE: 0.79 mg/dL (ref 0.50–1.35)
Calcium: 8.9 mg/dL (ref 8.4–10.5)
Glucose, Bld: 347 mg/dL — ABNORMAL HIGH (ref 70–99)
Potassium: 4.1 mEq/L (ref 3.7–5.3)
Sodium: 141 mEq/L (ref 137–147)

## 2014-01-12 LAB — LIPID PANEL
CHOL/HDL RATIO: 4.6 ratio
CHOLESTEROL: 146 mg/dL (ref 0–200)
HDL: 32 mg/dL — AB (ref >39)
LDL CALC: 73 mg/dL (ref 0–99)
TRIGLYCERIDES: 207 mg/dL — AB (ref <150)
VLDL: 41 mg/dL — ABNORMAL HIGH (ref 0–40)

## 2014-01-12 MED ORDER — INSULIN ASPART PROT & ASPART (70-30 MIX) 100 UNIT/ML ~~LOC~~ SUSP
50.0000 [IU] | Freq: Two times a day (BID) | SUBCUTANEOUS | Status: DC
  Administered 2014-01-13: 50 [IU] via SUBCUTANEOUS

## 2014-01-12 MED ORDER — INSULIN ASPART PROT & ASPART (70-30 MIX) 100 UNIT/ML ~~LOC~~ SUSP
10.0000 [IU] | Freq: Once | SUBCUTANEOUS | Status: AC
  Administered 2014-01-12: 10 [IU] via SUBCUTANEOUS
  Filled 2014-01-12: qty 10

## 2014-01-12 MED ORDER — INSULIN ASPART 100 UNIT/ML ~~LOC~~ SOLN: 8.0000 [IU] | Freq: Three times a day (TID) | SUBCUTANEOUS | Status: DC

## 2014-01-12 MED ORDER — INSULIN ASPART PROT & ASPART (70-30 MIX) 100 UNIT/ML ~~LOC~~ SUSP
40.0000 [IU] | Freq: Two times a day (BID) | SUBCUTANEOUS | Status: DC
  Administered 2014-01-12 (×2): 40 [IU] via SUBCUTANEOUS
  Filled 2014-01-12: qty 10

## 2014-01-12 MED ORDER — LISINOPRIL 5 MG PO TABS
5.0000 mg | ORAL_TABLET | Freq: Every day | ORAL | Status: DC
  Administered 2014-01-12 – 2014-01-13 (×2): 5 mg via ORAL
  Filled 2014-01-12 (×2): qty 1

## 2014-01-12 NOTE — Plan of Care (Signed)
Problem: Phase I Progression Outcomes Goal: Initial discharge plan identified Outcome: Completed/Met Date Met:  01/12/14 To return home     

## 2014-01-12 NOTE — Progress Notes (Signed)
Subjective:  Pt seen and examined in AM. No acute events overnight. Pt reports he is no longer thirsty and having good urine output. He is eating well with no abdominal pain, nausea, vomiting., or change in BM. He denies weakness or blurry vision.  He is ambulating without difficulty. He states he will be able to afford his insulin regimen.    Objective: Vital signs in last 24 hours: Filed Vitals:   01/11/14 0359 01/11/14 1145 01/11/14 2146 01/12/14 0608  BP: 134/87 159/91 124/81 158/90  Pulse: 79 76 86 83  Temp: 98.1 F (36.7 C) 98.1 F (36.7 C) 98.6 F (37 C) 98 F (36.7 C)  TempSrc: Oral Oral Oral Oral  Resp: 18 18 18 18   Height:      Weight: 181.7 kg (400 lb 9.2 oz)   181.7 kg (400 lb 9.2 oz)  SpO2: 99% 99% 98% 97%   Weight change: 1.5 kg (3 lb 4.9 oz)  Intake/Output Summary (Last 24 hours) at 01/12/14 0735 Last data filed at 01/11/14 1900  Gross per 24 hour  Intake 2095.9 ml  Output    350 ml  Net 1745.9 ml   General: NAD  Cardiac: Regular rate and rhythm   Pulm: clear to auscultation bilaterally  Abd: obese, soft, nontender, BS present  Ext: no edema  Neuro: alert and oriented X3   Lab Results: Basic Metabolic Panel:  Recent Labs Lab 01/09/14 1000  01/10/14 0100  01/11/14 0904 01/11/14 1831  NA 153*  < > 151*  < > 143 141  K 4.1  < > 3.4*  < > 3.5* 4.2  CL 113*  < > 109  < > 105 104  CO2 25  < > 24  < > 24 24  GLUCOSE 258*  < > 161*  < > 168* 259*  BUN 13  < > 11  < > 6 6  CREATININE 1.12  < > 1.15  < > 0.99 0.94  CALCIUM 10.0  < > 9.7  < > 9.0 8.8  MG  --   --  2.1  --   --   --   PHOS 2.3  --  3.1  --   --   --   < > = values in this interval not displayed. Liver Function Tests:  Recent Labs Lab 01/08/14 2106  AST 16  ALT 26  ALKPHOS 86  BILITOT 0.9  PROT 8.1  ALBUMIN 4.7   CBC:  Recent Labs Lab 01/09/14 0125  WBC 13.9*  NEUTROABS 11.6*  HGB 16.7  HCT 45.5  MCV 78.6  PLT 273   CBG:  Recent Labs Lab 01/11/14 1239  01/11/14 1335 01/11/14 1449 01/11/14 1541 01/11/14 1721 01/11/14 2143  GLUCAP 127* 152* 192* 226* 235* 281*    Urinalysis:  Recent Labs Lab 01/08/14 2104  COLORURINE STRAW*  LABSPEC <1.005*  PHURINE 5.5  GLUCOSEU >1000*  HGBUR NEGATIVE  BILIRUBINUR NEGATIVE  KETONESUR 15*  PROTEINUR NEGATIVE  UROBILINOGEN 0.2  NITRITE NEGATIVE  LEUKOCYTESUR NEGATIVE     Micro Results: Recent Results (from the past 240 hour(s))  MRSA PCR SCREENING     Status: None   Collection Time    01/09/14  6:17 AM      Result Value Ref Range Status   MRSA by PCR NEGATIVE  NEGATIVE Final   Comment:            The GeneXpert MRSA Assay (FDA     approved for NASAL specimens  only), is one component of a     comprehensive MRSA colonization     surveillance program. It is not     intended to diagnose MRSA     infection nor to guide or     monitor treatment for     MRSA infections.   Studies/Results: No results found. Medications: I have reviewed the patient's current medications. Scheduled Meds: . heparin  5,000 Units Subcutaneous 3 times per day  . influenza vac split quadrivalent PF  0.5 mL Intramuscular Tomorrow-1000  . insulin aspart  0-15 Units Subcutaneous TID WC  . insulin aspart  0-5 Units Subcutaneous QHS  . insulin aspart protamine- aspart  40 Units Subcutaneous BID WC  . sodium chloride  500 mL Intravenous Once   Continuous Infusions: . sodium chloride 125 mL/hr at 01/12/14 0605   PRN Meds:.dextrose, hydrALAZINE, promethazine Assessment/Plan:  Diabetic Ketoacidosis in setting of Type II Diabetes Mellitus - CBG 285, AG 13, IV insulin drip stopped on 3/4.   Pt with glucose of 1210, ketonuria, glucosuria (>1000), bicarbonate 19, vpH 7.359, and AG 25 on admission.  Percipitent most likely due to dehydration in setting of uncontrolled DM no longer on insulin.  Last A1c of 6.4 on 09/12/11, previously on 20 units Lantus BID and Novolog 15 units TID but on metformin 1000 mg BID  (since 07/13/11?). -Carb modified diet   -NS @ 172mL/hr for hypovolemia, d/c once volume repleted -D50 PRN CBG <70  -Start Novolog (70/30) 40 U BID  -Moderate sliding scale with meals and bedtime  -Promethazine 12.5 mg Q 4hr PRN nausea -Monitor glucose with meals and at bedtime -Obtain HbA1c --> 9.2 -Appreciate Diabetes Coordinator recommendations -Pt scheduled for hospital follow-up at [facility] on 3/26  Hypertension -Currently hypertensive. Pt previously on lisinopril however no longer taking. No proteinuria on UA. -IV hydralazine 2 mg Q8hr PRN if SBP >180 -Start lisinopril 5 mg daily -Continue to monitor  Hyperlipidemia - Last lipid panel on with hypertriglyceridemia (196) on 06/12/11. Pt was previously on simvastatin 20 mg daily (2012) however was not currently taking it prior to admission.  -Obtain lipid panel   Morbid Obesity - Pt with BMI of 49.8 in setting of uncontrolled Type II DM.  -Encourge weight loss and regular exercise -Obtain nutrition consult for diet modification   Hypernatremia - Resolved. Pt with Na level of 148-153 during admission. Most likely due to hypovolemia as pt was volume depleted on admission vs excess volume repletion with NS on admission.    -NS @ 165mL/hr for hypovolemia  -Monitor volume status  -Continue to monitor BMP  Hypokalemia - Resolved. Pt with K of 3.2-3.5 during admission most likely due to DKA and IV insulin drip. Magnesium levels were normal.  -Replete with KCl as needed -Continue to monitor BMP   Diet: Carb modified DVT PPx: SQ heparin TID Code: Full  Dispo: Tomorrow     The patient does have a current PCP (No Pcp Per Patient) and does need an [facility] hospital follow-up appointment after discharge.  The patient does not have transportation limitations that hinder transportation to clinic appointments.  .Services Needed at time of discharge: Y = Yes, Blank = No PT: No  OT: No  RN: No  Equipment: none  Other: none    LOS: 4 days     Otis Brace, MD 01/12/2014, 7:35 AM

## 2014-01-13 DIAGNOSIS — Z7901 Long term (current) use of anticoagulants: Secondary | ICD-10-CM

## 2014-01-13 DIAGNOSIS — E669 Obesity, unspecified: Secondary | ICD-10-CM

## 2014-01-13 LAB — BASIC METABOLIC PANEL
BUN: 8 mg/dL (ref 6–23)
CALCIUM: 8.9 mg/dL (ref 8.4–10.5)
CO2: 25 meq/L (ref 19–32)
CREATININE: 0.92 mg/dL (ref 0.50–1.35)
Chloride: 107 mEq/L (ref 96–112)
GFR calc Af Amer: >90 mL/min (ref >90)
GLUCOSE: 190 mg/dL — AB (ref 70–99)
Potassium: 3.8 mEq/L (ref 3.7–5.3)
Sodium: 144 mEq/L (ref 137–147)

## 2014-01-13 LAB — GLUCOSE, CAPILLARY
GLUCOSE-CAPILLARY: 209 mg/dL — AB (ref 70–99)
Glucose-Capillary: 217 mg/dL — ABNORMAL HIGH (ref 70–99)

## 2014-01-13 MED ORDER — "INSULIN SYRINGE-NEEDLE U-100 31G X 5/16"" 1 ML MISC": 1.0000 | Freq: Two times a day (BID) | Status: AC

## 2014-01-13 MED ORDER — INSULIN ASPART PROT & ASPART (70-30 MIX) 100 UNIT/ML ~~LOC~~ SUSP: 50.0000 [IU] | Freq: Two times a day (BID) | SUBCUTANEOUS | Status: DC

## 2014-01-13 MED ORDER — LANCETS MISC: 1.0000 | Freq: Three times a day (TID) | Status: AC

## 2014-01-13 MED ORDER — SIMVASTATIN 20 MG PO TABS: 20.0000 mg | ORAL_TABLET | Freq: Every day | ORAL | Status: AC

## 2014-01-13 MED ORDER — LISINOPRIL 5 MG PO TABS: 5.0000 mg | ORAL_TABLET | Freq: Every day | ORAL | Status: AC

## 2014-01-13 MED ORDER — INSULIN NPH ISOPHANE & REGULAR (70-30) 100 UNIT/ML ~~LOC~~ SUSP: 50.0000 [IU] | Freq: Two times a day (BID) | SUBCUTANEOUS | Status: DC

## 2014-01-13 MED ORDER — INSULIN NPH ISOPHANE & REGULAR (70-30) 100 UNIT/ML ~~LOC~~ SUSP: 50.0000 [IU] | Freq: Two times a day (BID) | SUBCUTANEOUS | Status: AC

## 2014-01-13 MED ORDER — RELION PRIME MONITOR DEVI: 1.0000 | Freq: Three times a day (TID) | Status: AC

## 2014-01-13 MED ORDER — GLUCOSE BLOOD VI STRP: ORAL_STRIP | Status: AC

## 2014-01-13 NOTE — Progress Notes (Signed)
Subjective:  Pt seen and examined in AM. No acute events overnight. No complaints. Pt ready to go home.    Objective: Vital signs in last 24 hours: Filed Vitals:   01/12/14 2045 01/13/14 0212 01/13/14 0536 01/13/14 1010  BP: 126/80 111/73 131/82 140/80  Pulse: 89 73 77   Temp: 97.5 F (36.4 C) 97.6 F (36.4 C) 97.8 F (36.6 C)   TempSrc: Oral Oral Oral   Resp: 18 18 18    Height:      Weight:   179.942 kg (396 lb 11.2 oz)   SpO2: 97% 100% 100%    Weight change: -1.758 kg (-3 lb 14 oz)  Intake/Output Summary (Last 24 hours) at 01/13/14 1136 Last data filed at 01/13/14 0539  Gross per 24 hour  Intake 1333.33 ml  Output   3650 ml  Net -2316.67 ml   General: NAD  Cardiac: Regular rate and rhythm   Pulm: clear to auscultation bilaterally  Abd: obese, soft, nontender, BS present  Ext: no edema  Neuro: alert and oriented X3   Lab Results: Basic Metabolic Panel:  Recent Labs Lab 01/09/14 1000  01/10/14 0100  01/12/14 1050 01/13/14 0558  NA 153*  < > 151*  < > 141 144  K 4.1  < > 3.4*  < > 4.1 3.8  CL 113*  < > 109  < > 106 107  CO2 25  < > 24  < > 21 25  GLUCOSE 258*  < > 161*  < > 347* 190*  BUN 13  < > 11  < > 8 8  CREATININE 1.12  < > 1.15  < > 0.79 0.92  CALCIUM 10.0  < > 9.7  < > 8.9 8.9  MG  --   --  2.1  --   --   --   PHOS 2.3  --  3.1  --   --   --   < > = values in this interval not displayed. Liver Function Tests:  Recent Labs Lab 01/08/14 2106  AST 16  ALT 26  ALKPHOS 86  BILITOT 0.9  PROT 8.1  ALBUMIN 4.7   CBC:  Recent Labs Lab 01/09/14 0125  WBC 13.9*  NEUTROABS 11.6*  HGB 16.7  HCT 45.5  MCV 78.6  PLT 273   CBG:  Recent Labs Lab 01/12/14 0801 01/12/14 1151 01/12/14 1735 01/12/14 2131 01/12/14 2235 01/13/14 0819  GLUCAP 285* 262* 325* 225* 215* 209*    Urinalysis:  Recent Labs Lab 01/08/14 2104  COLORURINE STRAW*  LABSPEC <1.005*  PHURINE 5.5  GLUCOSEU >1000*  HGBUR NEGATIVE  BILIRUBINUR NEGATIVE    KETONESUR 15*  PROTEINUR NEGATIVE  UROBILINOGEN 0.2  NITRITE NEGATIVE  LEUKOCYTESUR NEGATIVE     Micro Results: Recent Results (from the past 240 hour(s))  MRSA PCR SCREENING     Status: None   Collection Time    01/09/14  6:17 AM      Result Value Ref Range Status   MRSA by PCR NEGATIVE  NEGATIVE Final   Comment:            The GeneXpert MRSA Assay (FDA     approved for NASAL specimens     only), is one component of a     comprehensive MRSA colonization     surveillance program. It is not     intended to diagnose MRSA     infection nor to guide or     monitor treatment  for     MRSA infections.   Studies/Results: No results found. Medications: I have reviewed the patient's current medications. Scheduled Meds: . heparin  5,000 Units Subcutaneous 3 times per day  . insulin aspart  0-15 Units Subcutaneous TID WC  . insulin aspart  0-5 Units Subcutaneous QHS  . insulin aspart protamine- aspart  50 Units Subcutaneous BID WC  . lisinopril  5 mg Oral Daily  . sodium chloride  500 mL Intravenous Once   Continuous Infusions:   PRN Meds:.dextrose, hydrALAZINE, promethazine Assessment/Plan:  Diabetic Ketoacidosis in setting of Type II Diabetes Mellitus - resolved. CBG's 200's  Pt with glucose of 1210, ketonuria, glucosuria (>1000), bicarbonate 19, vpH 7.359, and AG 25 on admission.  Percipitent most likely due to dehydration in setting of uncontrolled DM no longer on insulin.  Last A1c of 6.4 on 09/12/11, previously on 20 units Lantus BID and Novolog 15 units TID but on metformin 1000 mg BID (since 07/13/11?). -Carb modified diet   -Novolog (70/30) 50 U BID  -Moderate sliding scale with meals and bedtime  -Promethazine 12.5 mg Q 4hr PRN nausea -Monitor glucose with meals and at bedtime -Obtain HbA1c --> 9.2 -Appreciate Diabetes Coordinator recommendations -Pt scheduled for hospital follow-up at [facility] on 3/26  Hypertension -Currently hypertensive. Pt previously on lisinopril  however no longer taking. No proteinuria on UA. -IV hydralazine 2 mg Q8hr PRN if SBP >180 -Continue lisinopril 5 mg daily and on discharge -Continue to monitor  Hyperlipidemia - Last lipid panel on with hypertriglyceridemia (196) on 06/12/11. Pt was previously on simvastatin 20 mg daily (2012) however was not currently taking it prior to admission.  -Obtain lipid panel (TG 207) -Start simvastatin 20 mg daily on discharge (previously prescribed but did not take it)  Morbid Obesity - Pt with BMI of 49.8 in setting of uncontrolled Type II DM.  -Encourge weight loss and regular exercise -Obtain nutrition consult for diet modification   Hypernatremia - Resolved. Pt with Na level of 148-153 during admission. Most likely due to hypovolemia as pt was volume depleted on admission vs excess volume repletion with NS on admission.    -NS @ 186mL/hr for hypovolemia  -Monitor volume status  -Continue to monitor BMP  Hypokalemia - Resolved. Pt with K of 3.2-3.5 during admission most likely due to DKA and IV insulin drip. Magnesium levels were normal.  -Replete with KCl as needed -Continue to monitor BMP   Diet: Carb modified DVT PPx: SQ heparin TID Code: Full  Dispo: Today   The patient does have a current PCP (No Pcp Per Patient) and does need an [facility] hospital follow-up appointment after discharge.  The patient does not have transportation limitations that hinder transportation to clinic appointments.  .Services Needed at time of discharge: Y = Yes, Blank = No PT: No  OT: No  RN: No  Equipment: none  Other: none    LOS: 5 days   Otis Brace, MD 01/13/2014, 11:36 AM

## 2014-01-13 NOTE — Discharge Instructions (Signed)
-  Inject Novolog (70/30) 50 U twice a day with meals and check your blood sugar at least 3 times a day -STOP taking metformin -Keep eating a low carbohydrate diet and exercising  -Start taking lisinopril 5 mg daily starting tomm on 3/17 for high blood pressure -Start taking simvastatin 20 mg daily starting today or tomorrow for high triglycerides -Please attend your follow-up appointments -Pleasure meeting you, hope you were satisfied with our care!

## 2014-01-13 NOTE — Progress Notes (Signed)
NURSING PROGRESS NOTE  Brad Murphy [id number] Discharge Data: 01/13/2014 2:56 PM Attending Provider: Dominic Pea, DO PCP:No PCP Per Patient     Phoebe Sharps to be D/C'd Home per MD order.  Discussed with the patient the After Visit Summary and all questions fully answered. All IV's discontinued with no bleeding noted. All belongings returned to patient for patient to take home. Skin is intact with no opened areas noted.   Last Vital Signs:  Blood pressure 150/75, pulse 80, temperature 98.2 F (36.8 C), temperature source Oral, resp. rate 18, height $RemoveBe'6\' 3"'zLYvWqYgL$  (1.905 m), weight 179.942 kg (396 lb 11.2 oz), SpO2 96.00%.  Discharge Medication List   Medication List    STOP taking these medications       metFORMIN 1000 MG tablet  Commonly known as:  GLUCOPHAGE     WAVESENSE PRESTO W/DEVICE Kit  Replaced by:  [place] these medications       glucose blood test strip  Use as instructed     insulin NPH-regular Human (70-30) 100 UNIT/ML injection  Commonly known as:  NOVOLIN 70/30 RELION  Inject 50 Units into the skin 2 (two) times daily with a meal.     Insulin Syringe-Needle U-100 31G X 5/16" 1 ML Misc  Commonly known as:  RELION INSULIN SYRINGE 1ML/31G  1 each by Does not apply route 2 (two) times daily with a meal.     Lancets Misc  1 each by Does not apply route 3 (three) times daily with meals.     lisinopril 5 MG tablet  Commonly known as:  PRINIVIL,ZESTRIL  Take 1 tablet (5 mg total) by mouth daily.     RELION PRIME MONITOR Devi  1 Device by Does not apply route 4 (four) times daily - after meals and at bedtime.     simvastatin 20 MG tablet  Commonly known as:  ZOCOR  Take 1 tablet (20 mg total) by mouth daily.         Wallie Renshaw, RN

## 2014-01-13 NOTE — Care Management Note (Signed)
    Page 1 of 1   01/13/2014     3:48:47 PM   CARE MANAGEMENT NOTE 01/13/2014  Patient:  Brad Murphy,Brad Murphy   Account Number:  [id number]  Date Initiated:  01/09/2014  Documentation initiated by:  Avie ArenasBROWN,SARAH  Subjective/Objective Assessment:   DKA     Action/Plan:   Anticipated DC Date:  01/13/2014   Anticipated DC Plan:  HOME/SELF CARE      DC Planning Services  CM consult      Choice offered to / List presented to:             Status of service:  Completed, signed off Medicare Important Message given?   (If response is "NO", the following Medicare IM given date fields will be blank) Date Medicare IM given:   Date Additional Medicare IM given:    Discharge Disposition:  HOME/SELF CARE  Per UR Regulation:  Reviewed for med. necessity/level of care/duration of stay  If discussed at Long Length of Stay Meetings, dates discussed:    Comments:  01/13/14 1544 Letha Capeeborah Aadhav Uhlig RN, BSN [phone] patient  is for dc today, NCM spoke with patient he states he can afford his insulin 70/30 and also he can afford the lisinopril which is on th $4 list and also the zocor 20mg  [facility]  Patient will follow up with internal medical clinic per Resident.  01-09-14 1:45pm Johny ShearsSarah Brown,RNBSN - [phone]  [phone]  [phone] Talked with diabetes educator.  Apparently was not getting his insulin while incarcerated. Was a patient with internal medicine in 2013 and inbetween then and now was incarcerated which may be why he was not seen.  Could he go back in Internal Medicine for follow up?

## 2014-01-13 NOTE — Progress Notes (Signed)
  Date: 01/13/2014  Patient name: Brad Murphy  Medical record number: [id number]  Date of birth: 18-Dec-1985   This patient has been seen and the plan of care was discussed with the house staff. Please see their note for complete details. I concur with their findings with the following additions/corrections: Feels well this morning. Walking around room without difficulty. Tolerating diet. Understands use of insulin. Agree with 70/30 insulin 50 units twice daily. He will need to check BG four times daily until seen in clinic due to recent initiation of insulin and recent DKA. Medically stable for D/C.  Jonah BlueAlejandro Litzy Dicker, DO, FACP Faculty [facility] 01/13/2014, 3:50 PM

## 2014-01-13 NOTE — Plan of Care (Signed)
Problem: Food- and Nutrition-Related Knowledge Deficit (NB-1.1) Goal: Nutrition education Formal process to instruct or train a patient/client in a skill or to impart knowledge to help patients/clients voluntarily manage or modify food choices and eating behavior to maintain or improve health.  Outcome: Completed/Met Date Met:  01/13/14  Brief Nutrition Note  RD consulted for nutrition education regarding diabetes. Pt was just visited by diabetic coordinator, notes reviewed. This RD discussed why RD was present, however pt said, "I just talked to one of y'all." Explained that he was seen by the Diabetes Coordinator and that she asked if I could come by to talk about nutrition. Pt states that he has "been through all of this before and it's not the first time." Pt said he got handouts from the Diabetes Coordinator about Counting Carbohydrates. RD also provided pt with additional handouts.  Per Diabetes Coordinator note, "Patient has been on insulin in the past but it was d/c'd after he lost a lot of weight. Had been exercising and trying to lose weight in the past. Has regained weight. Told him there might be a possibility to not require insulin in the future if he loses weight again. States he checked blood sugar three times daily when he was on insulin in the past. Will need f/u at [facility] and with Barry Brunner, RD, CDE to determine his diabetes management needs on an ongoing basis."     Lab Results  Component Value Date    HGBA1C 9.2* 01/11/2014    RD provided "Carbohydrate Counting for People with Diabetes" handout from the Academy of Nutrition and Dietetics. Discussed different food groups and their effects on blood sugar, emphasizing carbohydrate-containing foods. Provided list of carbohydrates and recommended serving sizes of common foods.  Discussed importance of controlled and consistent carbohydrate intake throughout the day. Provided examples of ways to balance meals/snacks and encouraged  intake of high-fiber, whole grain complex carbohydrates. Teach back method used.  Expect fair compliance.  Body mass index is 49.58 kg/(m^2). Pt meets criteria for Obese Class III based on current BMI.  Current diet order is Carbohydrate Modified Medium, patient is consuming approximately 50% of meals at this time. Labs and medications reviewed. No further nutrition interventions warranted at this time. RD contact information provided. If additional nutrition issues arise, please re-consult RD.  Inda Coke MS, RD, LDN Inpatient Registered Dietitian Pager: [phone] After-hours pager: [phone]

## 2014-01-13 NOTE — Progress Notes (Addendum)
Inpatient Diabetes Program Recommendations  AACE/ADA: New Consensus Statement on Inpatient Glycemic Control (2013)  Target Ranges:  Prepandial:   less than 140 mg/dL      Peak postprandial:   less than 180 mg/dL (1-2 hours)      Critically ill patients:  140 - 180 mg/dL   Reason for Visit: Request of nursing staff.  For possible discharge today.  Diabetes history: Type 2 diabetes, admitted with DKA, history of prior insulin use Outpatient Diabetes medications: Metformin 1000 mg bid per med rec Current orders for Inpatient glycemic control: Novolog 70/30 50 units BID  Note: TC from patient's nurse stating that patient does not seem to understand his insulin, self-care, etc.  Asked his nurse last night if there was a cure for diabetes.    Used teach-back method regarding action of insulin, timing for injections, composition of 70/30 insulin, hypoglycemia (s/s, treatment, and anticipation), and answered various questions regarding food.    Encouraged patient to watch Patient Education Videos particularly regarding meal planning and insulin.  Dietitian consult has been made.   Patient has been on insulin in the past but it was d/c'd after he lost a lot of weight.  Had been exercising and trying to lose weight in the past.  Has regained weight.  Told him there might be a possibility to not require insulin in the future if he loses weight again.  Sates he checked blood sugar three times daily when he was on insulin in the past.  [facility] with Gavin Poundonna Plyer, RD, CDE to determine his diabetes management needs on an ongoing basis.  Encouraged him to record meter readings and take with him to all clinic visits.  Gave patient handouts regarding a [facility] and generic ReliOn/ Novolin 70/30 insulin.  Used 1 cc syringes for insulin preparation when on insulin in the past.  Will need the following Rx at discharge:  ReliOn/Novolin 70/30 insulin (at 50 units BID, each vial will  last about 10 days)  1 cc ReliOn insulin syringes (1 box of 100- will need approx 60/month)  ReliOn Prime Blood Glucose Meter  ReliOn Prime Blood glucose test strips  Lancets to fit device that comes with meter  [facility] and ideally appt with Gavin Poundonna Plyer, RD, CDE set prior to discharge.  Thank you.  Jerret Mcbane S. Elsie Lincolnouth, RN, CNS, CDE Inpatient Diabetes Program, team pager [phone]     Addendum: Needs highest gauge 1 cc insulin syringe available-- with Rx written that patient can substitute a lower-priced syringe prn. Thank you.  Jinny Sweetland S. Elsie Lincolnouth, RN, CNS, CDE Inpatient Diabetes Program, team pager [phone]

## 2014-01-15 NOTE — Discharge Summary (Signed)
Name: Brad Murphy MRN: [id number] DOB: [birth year] 28 y.o. PCP: No Pcp Per Patient  Date of Admission: 01/08/2014 10:50 PM Date of Discharge: 01/13/2014 Attending Physician: Dominic Pea, DO  Discharge Diagnosis:  Primary: Diabetic Ketoacidosis in setting of Type II Diabetes Mellitus   Active Problems:   Hypertension Hyperlipidemia  Obesity Class II  Hypernatremia   Hypokalemia DVT Prophylaxis  Healthcare maintenance    Discharge Medications:   Medication List    STOP taking these medications       metFORMIN 1000 MG tablet  Commonly known as:  GLUCOPHAGE     WAVESENSE PRESTO W/DEVICE Kit  Replaced by:  [place] these medications       glucose blood test strip  Use as instructed     insulin NPH-regular Human (70-30) 100 UNIT/ML injection  Commonly known as:  NOVOLIN 70/30 RELION  Inject 50 Units into the skin 2 (two) times daily with a meal.     Insulin Syringe-Needle U-100 31G X 5/16" 1 ML Misc  Commonly known as:  RELION INSULIN SYRINGE 1ML/31G  1 each by Does not apply route 2 (two) times daily with a meal.     Lancets Misc  1 each by Does not apply route 3 (three) times daily with meals.     lisinopril 5 MG tablet  Commonly known as:  PRINIVIL,ZESTRIL  Take 1 tablet (5 mg total) by mouth daily.     RELION PRIME MONITOR Devi  1 Device by Does not apply route 4 (four) times daily - after meals and at bedtime.     simvastatin 20 MG tablet  Commonly known as:  ZOCOR  Take 1 tablet (20 mg total) by mouth daily.        Disposition and follow-up:   Mr.Markale Sunderlin was discharged from [facility] in Good condition.  At the hospital follow up visit please address:  1.  Uncontrolled T2DM - started back on insulin Novolog (70/30 ) 50 U BID - adjust as neccessary      Blood pressure - newly started on lisinopril, uptitrate if necessary      Tolerance to newly started simvastatin      2.  Labs / imaging  needed at time of follow-up: CBG  3.  Pending labs/ test needing follow-up: none   Follow-up Appointments:     Follow-up Information   Follow up with Vertell Novak, MD On 01/23/2014. [facility] )    Specialty:  Internal Medicine   Contact information:   [address] [place] [address] [phone]       Follow up with [name], Butch Penny, RD On 01/23/2014. (1:30 PM )    Specialty:  [place] information:   [place] [address] [phone]       Discharge Instructions: Discharge Orders   Future Appointments Provider Department Dept Phone   01/23/2014 1:30 PM [place], [place] [facility] [place] [phone]   01/23/2014 2:00 PM Olga Millers, MD [place] [phone]   Future Orders Complete By Expires   Diet Carb Modified  As directed       Consultations: none  Procedures Performed:  No results found.  2D Echo: none  Cardiac Cath: none  Admission HPI:   Mr. Hink is a 28 year old male with a PMH of DM type 2 (Hgb A1c 6.04 Sep 2011) who presents with complaint  of "feeling sick" for the past week. He feels dehydrated and reports polydipsia, polyuria and blurry vision. He has had decreased po, felt nauseous but has not vomited. He denies abdominal pain, chest pain, problems breathing or recent URI symptoms. In the past few days, he has been drinking lots of sugar sweetened beverages including Gatorade and eating hard candies. He also notes increase in his weight in the past few months of about fifty pounds. He says he is compliant with $RemoveBefore'1000mg'CEWcrkUcGPTUd$  Metformin BID, even in the past few days when he has not been feeling well. He reports his CBGs at home have been normal, 80-100s though he thinks his machine is malfunctioning. Today his meter read 13, so he drank some juice and it read 28. When he arrived to the ED CBG was > 600.   In the ED: T 98.48F, RR 14, SpO2 95%, HR 120, BP 170/8mmHg; he received 4  boluses of NSS and was placed on insulin gtt.    Hospital Course by problem list:   Diabetic Ketoacidosis in setting of Type II Diabetes Mellitus -  Pt presented with 1 week history of polydipsia, polyuria, blurry vision, decreased PO intake, recent weight gain, and increased consumption of sweetened beverages with compliance of metformin use at home. Pt presented with glucose of 1210, ketonuria, glucosuria (>1000), bicarbonate 19, vpH 7.359, and AG 25 on admission consistent with DKA. Percipitent most likely due to dehydration in setting of uncontrolled DM no longer on insulin therapy. Last A1c of 6.4 on 09/12/11 now 9.2 on 3/14. Pt previously on Novolin (70/30) 45 units Lantus BID in 2012  but on metformin 1000 mg BID after being incarcerated. DKA protocol was implemented and pt was on insulin drip for 3 days until closing of anion gap. Pt  then received carb modified diet with  moderate insulin sliding scale with meals and at bedtime with Novolog (70/30) 40-50 U BID. Pt's glucose was closely monitored with range of [phone] during hospitalization. Pt's symptoms resolved during hospitalization with AG on discharge of 12 and blood glucose level of 190. Per diabetes coordinator recommendations pt to discontinue metformin use and continue 50 U BID Novolog (70/30). Pt was given supplies and stated he would be able to afford insulin and supplies. Pt was instructed to monitor his blood glucose at least three times daily and to bring glucose meter to his hospital follow-up appointment for further control and management of Type II DM.    Hypertension - Pt with blood pressure range of 111/87 - 171/110 during hospitalization. Pt previously on lisinopril however was no longer taking it. Pt was started on lisinopril 5 mg daily and instructed to continue on discharge. Pt to have close hospital follow-up for monitoring of blood pressure and adjustments of medication as necessary.    Hyperlipidemia - Last lipid panel  with hypertriglyceridemia (196) on 06/12/11. Pt was previously prescribed simvastatin 20 mg daily (2012) however never took it. Lipid panel on 3/15 with hypertriglyceridemia (207) and low HDL (32). Pt was started on simvastatin 20 mg daily on discharge. Pt with normal liver function testing on 01/08/14.    Obesity Class II - Pt with BMI of 49.58 in setting of uncontrolled Type II DM. Pt received nutrition education by both RD and diabetes coordinator during hospitalization.    Hypernatremia  - Pt with Na level of 148-153 during admission most likely due to hypovolemia as pt was volume depleted on admission. Pt's free water deficit was corrected with fluids and with close  monitoring of levels to not overcorrect with normalization of values during hospitalization.   Hypokalemia - Pt with K of 3.2-3.5 during admission most likely due to DKA and IV insulin drip. Magnesium levels were normal. Pt's potassium was repleted as necessary with normalization of levels during hospitalization.   DVT Prophylaxis - Pt received SQ heparin during hospitalization with no reported thrombosis or HIT syndrome.   Healthcare maintenance - Pt received influenza vaccination and tested negative for HIV antibody during hospitalization.     Discharge Vitals:   BP 150/75  Pulse 80  Temp(Src) 98.2 F (36.8 C) (Oral)  Resp 18  Ht $R'6\' 3"'Hz$  (1.905 m)  Wt 179.942 kg (396 lb 11.2 oz)  BMI 49.58 kg/m2  SpO2 96%  Discharge Labs:  No results found for this or any previous visit (from the past 24 hour(s)).  Signed: Juluis Mire, MD 01/15/2014, 8:19 PM   Time Spent on Discharge: 60 minutes Services Ordered on Discharge: none Equipment Ordered on Discharge: none

## 2014-01-20 NOTE — Discharge Summary (Signed)
  Date: 01/20/2014  Patient name: Brad Murphy  Medical record number: [id number]  Date of birth: 12/24/1985   This patient has been seen and the plan of care was discussed with the house staff. Please see their note for complete details. I concur with their findings and plan.  Jonah BlueAlejandro Cloyde Oregel, DO, FACP Faculty [facility] 01/20/2014, 11:26 AM

## 2014-01-23 ENCOUNTER — Encounter: Payer: Self-pay | Admitting: Internal Medicine

## 2014-01-23 ENCOUNTER — Ambulatory Visit (INDEPENDENT_AMBULATORY_CARE_PROVIDER_SITE_OTHER): Payer: Self-pay | Admitting: Dietician

## 2014-01-23 ENCOUNTER — Ambulatory Visit (INDEPENDENT_AMBULATORY_CARE_PROVIDER_SITE_OTHER): Payer: Self-pay | Admitting: Internal Medicine

## 2014-01-23 VITALS — BP 127/78 | HR 73 | Temp 98.8°F | Ht 73.0 in | Wt 380.9 lb

## 2014-01-23 VITALS — Ht 73.0 in | Wt 380.9 lb

## 2014-01-23 DIAGNOSIS — IMO0002 Reserved for concepts with insufficient information to code with codable children: Secondary | ICD-10-CM

## 2014-01-23 DIAGNOSIS — E119 Type 2 diabetes mellitus without complications: Secondary | ICD-10-CM

## 2014-01-23 DIAGNOSIS — IMO0001 Reserved for inherently not codable concepts without codable children: Secondary | ICD-10-CM

## 2014-01-23 DIAGNOSIS — E1165 Type 2 diabetes mellitus with hyperglycemia: Secondary | ICD-10-CM

## 2014-01-23 LAB — GLUCOSE, CAPILLARY: Glucose-Capillary: 401 mg/dL — ABNORMAL HIGH (ref 70–99)

## 2014-01-23 MED ORDER — GLIPIZIDE 5 MG PO TABS: 5.0000 mg | ORAL_TABLET | Freq: Two times a day (BID) | ORAL | Status: AC

## 2014-01-23 MED ORDER — METFORMIN HCL 1000 MG PO TABS: 1000.0000 mg | ORAL_TABLET | Freq: Two times a day (BID) | ORAL | Status: AC

## 2014-01-23 NOTE — Assessment & Plan Note (Signed)
Will stop insulin as he has no meter and not eating much lately. He will taper up metformin to 1000 mg bid, he will start glipizide 5 mg bid as both are free at [facility]. Return in 4-6 weeks for check and uptitration of glipizide if able. He will work on exercising and having regular meals. He will speak to financial counselor after our visit today.

## 2014-01-23 NOTE — Assessment & Plan Note (Signed)
Patient was advised that he would benefit from weight loss and exercise in his overall health and stamina and his medical conditions. Given information about exercise and he will see diabetic educator back in 2-4 weeks.

## 2014-01-23 NOTE — Progress Notes (Signed)
Subjective:     Patient ID: Brad Murphy, male   DOB: 04-09-1986, 28 y.o.   MRN: [id number]  HPI The patient is a 28 YO man who has PMH of type 2 diabetes, obesity with recent admission for DKA. He was released from the hospital about 1-2 weeks ago. Since that time he has been taking insulin but has been unable to get a meter. He used to come to the clinic however was incarcerated and had been lost for some years. He is now back and would like to return to the clinic. He was evaluated by lab in the waiting room for lying on the ground and stating he though his sugar was low. Before he could be given juice this MD had them check his sugar which was 401. He was given a drink of water and recovered. He states that he is financially burdened by the medications started in the hospital and has not eaten today and this is not an uncommon occurrence lately for him. He is unable to afford even the $6 insulin from the health department (assuming he qualified and brought in required paperwork) and is also unable to afford his cholesterol medication. He states that he does have some diarrhea with the metformin but he feels worse from his sugars and is willing to try it again.   Review of Systems  Constitutional: Positive for activity change. Negative for fever, chills, diaphoresis, appetite change, fatigue and unexpected weight change.       Patient not doing much physically.   Respiratory: Negative for cough, chest tightness, shortness of breath and wheezing.   Cardiovascular: Negative for chest pain, palpitations and leg swelling.  Gastrointestinal: Negative for nausea, vomiting, abdominal pain, diarrhea, constipation and abdominal distention.  Endocrine: Positive for polydipsia and polyuria. Negative for polyphagia.  Neurological: Negative for dizziness, tremors, seizures, syncope, facial asymmetry, speech difficulty, weakness, light-headedness, numbness and headaches.       Objective:   Physical Exam   Constitutional: He is oriented to person, place, and time. He appears well-developed and well-nourished.  Morbidly obese  HENT:  Head: Normocephalic and atraumatic.  Eyes: EOM are normal. Pupils are equal, round, and reactive to light.  Neck: Normal range of motion. Neck supple. No JVD present. No tracheal deviation present. No thyromegaly present.  Cardiovascular: Normal rate, regular rhythm and normal heart sounds.   Pulmonary/Chest: Effort normal and breath sounds normal.  Respiratory sounds decreased due to habitus.  Abdominal: Soft. Bowel sounds are normal. He exhibits no distension and no mass. There is no tenderness. There is no rebound and no guarding.  Obese  Musculoskeletal: Normal range of motion. He exhibits no edema and no tenderness.  Lymphadenopathy:    He has no cervical adenopathy.  Neurological: He is alert and oriented to person, place, and time. No cranial nerve deficit.  Skin: Skin is warm and dry. No rash noted. No erythema. No pallor.       Assessment/Plan:   1. Please see problem oriented charting.   2. Disposition - Patient will return in about 4-6 weeks for sugar recheck. Foot exam performed at today's visit. He will take 10 units of insulin at night time until it is gone. He will start metformin with taper up to 1000 mg BID and start glipizide 5 mg BID (which should be uptitrated at next visit in 4-6 weeks). He will call if problems with diarrhea. He will hold the statin for now and take the lisinopril.

## 2014-01-23 NOTE — Progress Notes (Signed)
Feeling Serious Problem  1. Feeling overwhelmed by the demands of living with diabetes. 5 6  2. Feeling that I am often failing with my diabetes regimen. 5 6   Diabetes Self-Management Education  Visit Number: Follow-up 2  01/23/2014 [name], identified by name and date of birth, is a 28 y.o. male with  T2 DM        .    ASSESSMENT  Patient Concerns:    financial assistance for diabetes medicine   Height 6\' 1"  (1.854 m), weight 380 lb 14.4 oz (172.775 kg). Body mass index is 50.26 kg/(m^2).  Lab Results: LDL Cholesterol  Date Value Ref Range Status  01/12/2014 73  0 - 99 mg/dL Final                Hemoglobin A1C  Date Value Ref Range Status  01/11/2014 9.2* <5.7 % Final     (NOTE)                                                                            09/12/2011 6.4   Final     Family History  Problem Relation Age of Onset  . Hypertension Mother   . Diabetes Maternal Grandfather    History  Substance Use Topics  . Smoking status: Former Smoker -- 0.30 packs/day    Types: Cigarettes    Quit date: 05/01/2011  . Smokeless tobacco: Not on file     Comment: social smoker  . Alcohol Use: Yes     Comment: beer 2 months ago    Support Systems:    Special Needs:    is young,has difficulty with self management of life needs much less a chronic illness Prior DM Education:  yes Daily Foot Exams:   no Patient Belief / Attitude about Diabetes:     Assessment comments: Discussed patient concerns with doctor and financial counselor.   Diet Recall: vague due to not feeling good today- regular soda once a week, little milk, lot's of water and sprite zero, had 6" cheesesteak yesterday, did not eat fries- having tooth pain making him "afraid " to eat    Individualized Plan for Diabetes Self-Management Training:  Patient individualized diabetes plan discussed today with patient and includes: , Dealing daily with diabetes  Education Topics Reviewed with Patient  Today:  Topic Points Discussed  Disease State    Nutrition Management    Physical Activity and Exercise    Medications    Monitoring    Acute Complications    Chronic Complications    Psychosocial Adjustment Worked with patient to identify barriers to care and solutions;Identified and addressed patients feelings and concerns about diabetes  Goal Setting    Preconception Care (if applicable)      PATIENTS GOALS   Learning Objective(s):     Goal The patient agrees to:  Nutrition    Physical Activity    Medications    Monitoring    Problem Solving    Reducing Risk    Health Coping ask for help with diabetes stress including financial constraints   Patient Self-Evaluation of Goals (Subsequent Visits)  Goal The patient rates self as meeting goals (% of time)  Nutrition  Physical Activity    Medications    Monitoring    Problem Solving    Reducing Risk    Health Coping       PERSONALIZED PLAN / SUPPORT  Self-Management Support:  Doctor's office;CDE visits ______________________________________________________________________  Outcomes  Expected Outcomes:  Demonstrated limited interest in learning.  Expect minimal changes Self-Care Barriers:  Debilitated state due to current medical condition;Lack of material resources Education material provided: yes- social work carb, Social services information and appointment made with financial counselor today If problems or questions, patient to contact team via:  Phone Time in: 1330     Time out: 1400 Future DSME appointment: - 2 wks   [name], [name] 01/23/2014 2:21 PM

## 2014-01-23 NOTE — Patient Instructions (Signed)
Take 10 units of insulin at night each night until it is gone.   Start taking metformin 1/2 pill in the morning for 4 days, then take 1/2 pill in the morning and evening for 4 days, then take 1 pill in the morning and 1/2 pill in the evening for 4 days, then take 1 pill in the morning and 1 pill in the evening until you return.  Start taking glipizide 1 pill with morning meal and 1 pill with evening meal. This medicine will help to bring down your sugar.   Both of your new diabetes pills will be free at [employer].   We would like you to talk to our financial advisor to get help with affording your medicines.   Come back in 4-6 weeks to check on your sugars and your weight.  Work on exercising to help your body use the sugar better and lower your blood sugars and help your diabetes.   Call us with questions or problems at [phone].  Please bring your medicines with you each time you come.   Medicines may be  Eye drops  Herbal   Vitamins  Pills  Seeing these help us take care of you.  Exercise to Lose Weight Exercise and a healthy diet may help you lose weight. Your doctor may suggest specific exercises. EXERCISE IDEAS AND TIPS  Choose low-cost things you enjoy doing, such as walking, bicycling, or exercising to workout videos.  Take stairs instead of the elevator.  Walk during your lunch break.  Park your car further away from work or school.  Go to a gym or an exercise class.  Start with 5 to 10 minutes of exercise each day. Build up to 30 minutes of exercise 4 to 6 days a week.  Wear shoes with good support and comfortable clothes.  Stretch before and after working out.  Work out until you breathe harder and your heart beats faster.  Drink extra water when you exercise.  Do not do so much that you hurt yourself, feel dizzy, or get very short of breath. Exercises that burn about 150 calories:  Running 1  miles in 15 minutes.  Playing volleyball for  45 to 60 minutes.  Washing and waxing a car for 45 to 60 minutes.  Playing touch football for 45 minutes.  Walking 1  miles in 35 minutes.  Pushing a stroller 1  miles in 30 minutes.  Playing basketball for 30 minutes.  Raking leaves for 30 minutes.  Bicycling 5 miles in 30 minutes.  Walking 2 miles in 30 minutes.  Dancing for 30 minutes.  Shoveling snow for 15 minutes.  Swimming laps for 20 minutes.  Walking up stairs for 15 minutes.  Bicycling 4 miles in 15 minutes.  Gardening for 30 to 45 minutes.  Jumping rope for 15 minutes.  Washing windows or floors for 45 to 60 minutes. Document Released: 11/19/2010 Document Revised: 01/09/2012 Document Reviewed: 11/19/2010 [facility] Patient Information 2014 [employer] [place].

## 2014-01-23 NOTE — Patient Instructions (Addendum)
Please see the FINANCIAL COUNSELOR  [facility]([place]) today.  Eating about the same amount of carbs at meals and snacks should help your blood sugars. About 5 carb choices at each meal and 1-2 for snacks may work for you   Examples:  2 starch + 2 fruit + 1 dairy = 5 carb choices    2 starch + 2 dairy + 1 fruit = 5 carb choices    3 starch + 2 dairy = 5 carb choices    2 starch + 1 dairy + 2 fruit = 5 carb choices    3 starch + 2 fruit = 5 carb choices    5 starches = 5 carb choices  Veggies, healthy fats(avocado) and meats do not count- add them as needed.

## 2014-01-27 NOTE — Progress Notes (Signed)
Case discussed with Dr. Kollar soon after the resident saw the patient.  We reviewed the resident's history and exam and pertinent patient test results.  I agree with the assessment, diagnosis, and plan of care documented in the resident's note. 

## 2014-02-06 ENCOUNTER — Ambulatory Visit: Payer: Self-pay

## 2014-02-20 ENCOUNTER — Ambulatory Visit: Payer: Self-pay | Admitting: Internal Medicine
